# Patient Record
Sex: Female | Born: 1980
Health system: Southern US, Community
[De-identification: ages and names within clinical notes are randomized; demographics above are authoritative.]

## PROBLEM LIST (undated history)

## (undated) DIAGNOSIS — I10 Essential (primary) hypertension: Secondary | ICD-10-CM

## (undated) DIAGNOSIS — E559 Vitamin D deficiency, unspecified: Secondary | ICD-10-CM

## (undated) DIAGNOSIS — F32A Depression, unspecified: Secondary | ICD-10-CM

## (undated) DIAGNOSIS — F419 Anxiety disorder, unspecified: Secondary | ICD-10-CM

## (undated) DIAGNOSIS — E538 Deficiency of other specified B group vitamins: Secondary | ICD-10-CM

## (undated) DIAGNOSIS — R7303 Prediabetes: Secondary | ICD-10-CM

## (undated) DIAGNOSIS — G479 Sleep disorder, unspecified: Secondary | ICD-10-CM

## (undated) DIAGNOSIS — D219 Benign neoplasm of connective and other soft tissue, unspecified: Secondary | ICD-10-CM

## (undated) DIAGNOSIS — L509 Urticaria, unspecified: Secondary | ICD-10-CM

## (undated) DIAGNOSIS — G4730 Sleep apnea, unspecified: Secondary | ICD-10-CM

## (undated) DIAGNOSIS — D649 Anemia, unspecified: Secondary | ICD-10-CM

## (undated) DIAGNOSIS — I38 Endocarditis, valve unspecified: Secondary | ICD-10-CM

## (undated) DIAGNOSIS — F329 Major depressive disorder, single episode, unspecified: Secondary | ICD-10-CM

## (undated) DIAGNOSIS — O211 Hyperemesis gravidarum with metabolic disturbance: Secondary | ICD-10-CM

## (undated) HISTORY — DX: Anemia, unspecified: D64.9

## (undated) HISTORY — DX: Sleep apnea, unspecified: G47.30

## (undated) HISTORY — DX: Essential (primary) hypertension: I10

## (undated) HISTORY — DX: Vitamin D deficiency, unspecified: E55.9

## (undated) HISTORY — DX: Prediabetes: R73.03

## (undated) HISTORY — DX: Depression, unspecified: F32.A

## (undated) HISTORY — DX: Deficiency of other specified B group vitamins: E53.8

## (undated) HISTORY — DX: Urticaria, unspecified: L50.9

## (undated) HISTORY — DX: Endocarditis, valve unspecified: I38

## (undated) HISTORY — DX: Sleep disorder, unspecified: G47.9

## (undated) HISTORY — DX: Anxiety disorder, unspecified: F41.9

---
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
## 2005-01-08 ENCOUNTER — Emergency Department (HOSPITAL_COMMUNITY): Admission: EM | Admit: 2005-01-08 | Discharge: 2005-01-09 | Payer: Self-pay | Admitting: Emergency Medicine

## 2007-06-23 ENCOUNTER — Other Ambulatory Visit: Admission: RE | Admit: 2007-06-23 | Discharge: 2007-06-23 | Payer: Self-pay | Admitting: Gynecology

## 2008-11-19 ENCOUNTER — Inpatient Hospital Stay (HOSPITAL_COMMUNITY): Admission: AD | Admit: 2008-11-19 | Discharge: 2008-11-19 | Payer: Self-pay | Admitting: Obstetrics and Gynecology

## 2008-11-25 ENCOUNTER — Inpatient Hospital Stay (HOSPITAL_COMMUNITY): Admission: AD | Admit: 2008-11-25 | Discharge: 2008-11-25 | Payer: Self-pay | Admitting: Obstetrics and Gynecology

## 2008-11-30 ENCOUNTER — Inpatient Hospital Stay (HOSPITAL_COMMUNITY): Admission: AD | Admit: 2008-11-30 | Discharge: 2008-12-02 | Payer: Self-pay | Admitting: Obstetrics and Gynecology

## 2008-12-08 ENCOUNTER — Inpatient Hospital Stay (HOSPITAL_COMMUNITY): Admission: AD | Admit: 2008-12-08 | Discharge: 2008-12-12 | Payer: Self-pay | Admitting: Obstetrics and Gynecology

## 2008-12-23 ENCOUNTER — Inpatient Hospital Stay (HOSPITAL_COMMUNITY): Admission: AD | Admit: 2008-12-23 | Discharge: 2008-12-28 | Payer: Self-pay | Admitting: Obstetrics and Gynecology

## 2009-01-01 ENCOUNTER — Inpatient Hospital Stay (HOSPITAL_COMMUNITY): Admission: AD | Admit: 2009-01-01 | Discharge: 2009-01-01 | Payer: Self-pay | Admitting: Obstetrics and Gynecology

## 2009-01-08 ENCOUNTER — Observation Stay (HOSPITAL_COMMUNITY): Admission: AD | Admit: 2009-01-08 | Discharge: 2009-01-08 | Payer: Self-pay | Admitting: Obstetrics and Gynecology

## 2009-01-09 ENCOUNTER — Inpatient Hospital Stay (HOSPITAL_COMMUNITY): Admission: AD | Admit: 2009-01-09 | Discharge: 2009-01-10 | Payer: Self-pay | Admitting: Obstetrics and Gynecology

## 2009-01-20 ENCOUNTER — Inpatient Hospital Stay (HOSPITAL_COMMUNITY): Admission: AD | Admit: 2009-01-20 | Discharge: 2009-01-20 | Payer: Self-pay | Admitting: Obstetrics and Gynecology

## 2009-01-22 ENCOUNTER — Inpatient Hospital Stay (HOSPITAL_COMMUNITY): Admission: AD | Admit: 2009-01-22 | Discharge: 2009-01-23 | Payer: Self-pay | Admitting: Obstetrics and Gynecology

## 2009-01-23 ENCOUNTER — Ambulatory Visit (HOSPITAL_COMMUNITY): Admission: RE | Admit: 2009-01-23 | Discharge: 2009-01-23 | Payer: Self-pay | Admitting: Obstetrics and Gynecology

## 2009-01-24 ENCOUNTER — Inpatient Hospital Stay (HOSPITAL_COMMUNITY): Admission: AD | Admit: 2009-01-24 | Discharge: 2009-01-25 | Payer: Self-pay | Admitting: Obstetrics and Gynecology

## 2009-01-25 ENCOUNTER — Emergency Department (HOSPITAL_COMMUNITY): Admission: EM | Admit: 2009-01-25 | Discharge: 2009-01-25 | Payer: Self-pay | Admitting: Emergency Medicine

## 2009-01-29 ENCOUNTER — Inpatient Hospital Stay (HOSPITAL_COMMUNITY): Admission: AD | Admit: 2009-01-29 | Discharge: 2009-01-29 | Payer: Self-pay | Admitting: Obstetrics and Gynecology

## 2009-02-09 ENCOUNTER — Inpatient Hospital Stay (HOSPITAL_COMMUNITY): Admission: AD | Admit: 2009-02-09 | Discharge: 2009-02-17 | Payer: Self-pay | Admitting: Obstetrics and Gynecology

## 2009-03-02 HISTORY — PX: PICC LINE PLACE PERIPHERAL (ARMC HX): HXRAD1248

## 2009-03-15 ENCOUNTER — Emergency Department (HOSPITAL_COMMUNITY): Admission: EM | Admit: 2009-03-15 | Discharge: 2009-03-16 | Payer: Self-pay | Admitting: Emergency Medicine

## 2009-03-16 ENCOUNTER — Emergency Department (HOSPITAL_COMMUNITY): Admission: EM | Admit: 2009-03-16 | Discharge: 2009-03-16 | Payer: Self-pay | Admitting: Emergency Medicine

## 2009-03-17 ENCOUNTER — Emergency Department (HOSPITAL_COMMUNITY): Admission: EM | Admit: 2009-03-17 | Discharge: 2009-03-17 | Payer: Self-pay | Admitting: Emergency Medicine

## 2009-05-01 ENCOUNTER — Ambulatory Visit (HOSPITAL_COMMUNITY): Admission: RE | Admit: 2009-05-01 | Discharge: 2009-05-01 | Payer: Self-pay | Admitting: Obstetrics and Gynecology

## 2009-05-10 ENCOUNTER — Inpatient Hospital Stay (HOSPITAL_COMMUNITY): Admission: AD | Admit: 2009-05-10 | Discharge: 2009-05-10 | Payer: Self-pay | Admitting: Obstetrics and Gynecology

## 2009-05-11 ENCOUNTER — Emergency Department (HOSPITAL_COMMUNITY): Admission: EM | Admit: 2009-05-11 | Discharge: 2009-05-11 | Payer: Self-pay | Admitting: Emergency Medicine

## 2009-07-08 ENCOUNTER — Inpatient Hospital Stay (HOSPITAL_COMMUNITY): Admission: AD | Admit: 2009-07-08 | Discharge: 2009-07-08 | Payer: Self-pay | Admitting: Obstetrics and Gynecology

## 2009-07-09 ENCOUNTER — Inpatient Hospital Stay (HOSPITAL_COMMUNITY): Admission: AD | Admit: 2009-07-09 | Discharge: 2009-07-12 | Payer: Self-pay | Admitting: Obstetrics and Gynecology

## 2010-02-26 ENCOUNTER — Inpatient Hospital Stay (HOSPITAL_COMMUNITY)
Admission: AD | Admit: 2010-02-26 | Discharge: 2010-02-27 | Payer: Self-pay | Source: Home / Self Care | Attending: Obstetrics and Gynecology | Admitting: Obstetrics and Gynecology

## 2010-02-28 ENCOUNTER — Inpatient Hospital Stay (HOSPITAL_COMMUNITY)
Admission: AD | Admit: 2010-02-28 | Discharge: 2010-03-01 | Payer: Self-pay | Source: Home / Self Care | Attending: Obstetrics and Gynecology | Admitting: Obstetrics and Gynecology

## 2010-03-17 ENCOUNTER — Inpatient Hospital Stay (HOSPITAL_COMMUNITY)
Admission: AD | Admit: 2010-03-17 | Discharge: 2010-03-17 | Payer: Self-pay | Source: Home / Self Care | Attending: Obstetrics and Gynecology | Admitting: Obstetrics and Gynecology

## 2010-03-19 LAB — COMPREHENSIVE METABOLIC PANEL
ALT: 9 U/L (ref 0–35)
AST: 14 U/L (ref 0–37)
Albumin: 3.7 g/dL (ref 3.5–5.2)
Alkaline Phosphatase: 49 U/L (ref 39–117)
BUN: 11 mg/dL (ref 6–23)
CO2: 23 mEq/L (ref 19–32)
Calcium: 9.5 mg/dL (ref 8.4–10.5)
Chloride: 106 mEq/L (ref 96–112)
Creatinine, Ser: 0.56 mg/dL (ref 0.4–1.2)
GFR calc Af Amer: >60 mL/min (ref >60)
GFR calc non Af Amer: >60 mL/min (ref >60)
Glucose, Bld: 73 mg/dL (ref 70–99)
Potassium: 3.7 mEq/L (ref 3.5–5.1)
Sodium: 136 mEq/L (ref 135–145)
Total Bilirubin: 0.7 mg/dL (ref 0.3–1.2)
Total Protein: 6.5 g/dL (ref 6.0–8.3)

## 2010-03-19 LAB — CBC
HCT: 38.4 % (ref 36.0–46.0)
Hemoglobin: 13.2 g/dL (ref 12.0–15.0)
MCH: 29.8 pg (ref 26.0–34.0)
MCHC: 34.4 g/dL (ref 30.0–36.0)
MCV: 86.7 fL (ref 78.0–100.0)
Platelets: 248 10*3/uL (ref 150–400)
RBC: 4.43 MIL/uL (ref 3.87–5.11)
RDW: 13.2 % (ref 11.5–15.5)
WBC: 8.2 10*3/uL (ref 4.0–10.5)

## 2010-03-19 LAB — URINALYSIS, ROUTINE W REFLEX MICROSCOPIC
Bilirubin Urine: NEGATIVE
Hgb urine dipstick: NEGATIVE
Ketones, ur: >80 mg/dL — AB
Nitrite: NEGATIVE
Protein, ur: NEGATIVE mg/dL
Specific Gravity, Urine: >1.03 — ABNORMAL HIGH (ref 1.005–1.030)
Urine Glucose, Fasting: NEGATIVE mg/dL
Urobilinogen, UA: 0.2 mg/dL (ref 0.0–1.0)
pH: 6 (ref 5.0–8.0)

## 2010-03-19 LAB — TSH: TSH: 0.662 u[IU]/mL (ref 0.350–4.500)

## 2010-03-19 LAB — T3 UPTAKE: T3 Uptake Ratio: 22.4 % — ABNORMAL LOW (ref 22.5–37.0)

## 2010-03-19 LAB — PREALBUMIN: Prealbumin: 24.4 mg/dL (ref 17.0–34.0)

## 2010-03-19 LAB — T4, FREE: Free T4: 1.29 ng/dL (ref 0.80–1.80)

## 2010-03-20 ENCOUNTER — Inpatient Hospital Stay (HOSPITAL_COMMUNITY)
Admission: AD | Admit: 2010-03-20 | Discharge: 2010-03-21 | Payer: Self-pay | Source: Home / Self Care | Admitting: Obstetrics and Gynecology

## 2010-03-23 ENCOUNTER — Encounter: Payer: Self-pay | Admitting: Obstetrics and Gynecology

## 2010-03-24 LAB — URINALYSIS, ROUTINE W REFLEX MICROSCOPIC
Bilirubin Urine: NEGATIVE
Ketones, ur: >80 mg/dL — AB
Leukocytes, UA: NEGATIVE
Nitrite: NEGATIVE
Protein, ur: NEGATIVE mg/dL
Specific Gravity, Urine: >1.03 — ABNORMAL HIGH (ref 1.005–1.030)
Urine Glucose, Fasting: NEGATIVE mg/dL
Urobilinogen, UA: 0.2 mg/dL (ref 0.0–1.0)
pH: 6 (ref 5.0–8.0)

## 2010-03-24 LAB — URINE MICROSCOPIC-ADD ON

## 2010-03-30 ENCOUNTER — Inpatient Hospital Stay (HOSPITAL_COMMUNITY)
Admission: AD | Admit: 2010-03-30 | Discharge: 2010-03-30 | Payer: Self-pay | Source: Home / Self Care | Attending: Obstetrics and Gynecology | Admitting: Obstetrics and Gynecology

## 2010-03-30 LAB — URINE MICROSCOPIC-ADD ON

## 2010-03-30 LAB — URINALYSIS, ROUTINE W REFLEX MICROSCOPIC
Bilirubin Urine: NEGATIVE
Hgb urine dipstick: NEGATIVE
Ketones, ur: 15 mg/dL — AB
Nitrite: NEGATIVE
Protein, ur: NEGATIVE mg/dL
Specific Gravity, Urine: >1.03 — ABNORMAL HIGH (ref 1.005–1.030)
Urine Glucose, Fasting: NEGATIVE mg/dL
Urobilinogen, UA: 0.2 mg/dL (ref 0.0–1.0)
pH: 6 (ref 5.0–8.0)

## 2010-04-11 ENCOUNTER — Inpatient Hospital Stay (HOSPITAL_COMMUNITY)
Admission: AD | Admit: 2010-04-11 | Discharge: 2010-04-11 | Disposition: A | Discharge status: Home / Self Care | Payer: Medicaid Other | Source: Ambulatory Visit | Attending: Obstetrics and Gynecology | Admitting: Obstetrics and Gynecology

## 2010-04-11 DIAGNOSIS — O210 Mild hyperemesis gravidarum: Secondary | ICD-10-CM | POA: Insufficient documentation

## 2010-04-11 DIAGNOSIS — R824 Acetonuria: Secondary | ICD-10-CM | POA: Insufficient documentation

## 2010-04-11 LAB — COMPREHENSIVE METABOLIC PANEL
ALT: 10 U/L (ref 0–35)
AST: 11 U/L (ref 0–37)
Albumin: 3.5 g/dL (ref 3.5–5.2)
Alkaline Phosphatase: 48 U/L (ref 39–117)
BUN: 9 mg/dL (ref 6–23)
CO2: 22 mEq/L (ref 19–32)
Calcium: 9.3 mg/dL (ref 8.4–10.5)
Chloride: 107 mEq/L (ref 96–112)
Creatinine, Ser: 0.55 mg/dL (ref 0.4–1.2)
GFR calc Af Amer: >60 mL/min (ref >60)
GFR calc non Af Amer: >60 mL/min (ref >60)
Glucose, Bld: 82 mg/dL (ref 70–99)
Potassium: 3.5 mEq/L (ref 3.5–5.1)
Sodium: 137 mEq/L (ref 135–145)
Total Bilirubin: 1.1 mg/dL (ref 0.3–1.2)
Total Protein: 6.1 g/dL (ref 6.0–8.3)

## 2010-04-11 LAB — URINE MICROSCOPIC-ADD ON

## 2010-04-11 LAB — URINALYSIS, ROUTINE W REFLEX MICROSCOPIC
Hgb urine dipstick: NEGATIVE
Ketones, ur: >80 mg/dL — AB
Leukocytes, UA: NEGATIVE
Nitrite: NEGATIVE
Protein, ur: 30 mg/dL — AB
Specific Gravity, Urine: >1.03 — ABNORMAL HIGH (ref 1.005–1.030)
Urine Glucose, Fasting: NEGATIVE mg/dL
Urobilinogen, UA: 1 mg/dL (ref 0.0–1.0)
pH: 6 (ref 5.0–8.0)

## 2010-04-11 LAB — PREALBUMIN: Prealbumin: 21.5 mg/dL (ref 17.0–34.0)

## 2010-04-15 LAB — HIV ANTIBODY (ROUTINE TESTING W REFLEX): HIV: NONREACTIVE

## 2010-04-15 LAB — HEPATITIS B SURFACE ANTIGEN: Hepatitis B Surface Ag: NEGATIVE

## 2010-04-15 LAB — RPR: RPR: NONREACTIVE

## 2010-04-15 LAB — ABO/RH: "RH Type ": POSITIVE

## 2010-05-12 LAB — URINALYSIS, ROUTINE W REFLEX MICROSCOPIC
Bilirubin Urine: NEGATIVE
Bilirubin Urine: NEGATIVE
Glucose, UA: NEGATIVE mg/dL
Ketones, ur: >80 mg/dL — AB
Ketones, ur: >80 mg/dL — AB
Leukocytes, UA: NEGATIVE
Nitrite: NEGATIVE
Nitrite: NEGATIVE
Protein, ur: NEGATIVE mg/dL
Specific Gravity, Urine: >1.03 — ABNORMAL HIGH (ref 1.005–1.030)
Urobilinogen, UA: 0.2 mg/dL (ref 0.0–1.0)
Urobilinogen, UA: 1 mg/dL (ref 0.0–1.0)
pH: 6 (ref 5.0–8.0)

## 2010-05-12 LAB — URINE CULTURE: Colony Count: 30000

## 2010-05-12 LAB — URINE MICROSCOPIC-ADD ON

## 2010-05-19 LAB — CULTURE, ROUTINE-ABSCESS: Culture: NO GROWTH

## 2010-05-19 LAB — URINALYSIS, ROUTINE W REFLEX MICROSCOPIC
Bilirubin Urine: NEGATIVE
Glucose, UA: NEGATIVE mg/dL
Hgb urine dipstick: NEGATIVE
Ketones, ur: 40 mg/dL — AB
Protein, ur: NEGATIVE mg/dL
pH: 7.5 (ref 5.0–8.0)

## 2010-05-20 LAB — CBC
HCT: 31.7 % — ABNORMAL LOW (ref 36.0–46.0)
Hemoglobin: 10.7 g/dL — ABNORMAL LOW (ref 12.0–15.0)
MCHC: 34 g/dL (ref 30.0–36.0)
Platelets: 202 10*3/uL (ref 150–400)
RBC: 3.51 MIL/uL — ABNORMAL LOW (ref 3.87–5.11)
RBC: 4.13 MIL/uL (ref 3.87–5.11)
WBC: 12.1 10*3/uL — ABNORMAL HIGH (ref 4.0–10.5)
WBC: 13.8 10*3/uL — ABNORMAL HIGH (ref 4.0–10.5)

## 2010-05-20 LAB — RPR: RPR Ser Ql: NONREACTIVE

## 2010-05-26 LAB — URINALYSIS, ROUTINE W REFLEX MICROSCOPIC
Bilirubin Urine: NEGATIVE
Glucose, UA: NEGATIVE mg/dL
Nitrite: NEGATIVE
Specific Gravity, Urine: <1.005 — ABNORMAL LOW (ref 1.005–1.030)
pH: 7 (ref 5.0–8.0)

## 2010-05-26 LAB — WET PREP, GENITAL: Yeast Wet Prep HPF POC: NONE SEEN

## 2010-05-26 LAB — GC/CHLAMYDIA PROBE AMP, GENITAL: Chlamydia, DNA Probe: NEGATIVE

## 2010-06-02 LAB — GLUCOSE, CAPILLARY
Glucose-Capillary: 102 mg/dL — ABNORMAL HIGH (ref 70–99)
Glucose-Capillary: 123 mg/dL — ABNORMAL HIGH (ref 70–99)

## 2010-06-03 LAB — COMPREHENSIVE METABOLIC PANEL
AST: 15 U/L (ref 0–37)
Albumin: 2.5 g/dL — ABNORMAL LOW (ref 3.5–5.2)
Calcium: 8.3 mg/dL — ABNORMAL LOW (ref 8.4–10.5)
Creatinine, Ser: 0.44 mg/dL (ref 0.4–1.2)
GFR calc Af Amer: >60 mL/min (ref >60)
Total Protein: 4.9 g/dL — ABNORMAL LOW (ref 6.0–8.3)

## 2010-06-03 LAB — KETONES, URINE: Ketones, ur: NEGATIVE mg/dL

## 2010-06-03 LAB — URINALYSIS, ROUTINE W REFLEX MICROSCOPIC
Nitrite: NEGATIVE
Specific Gravity, Urine: 1.015 (ref 1.005–1.030)
Urobilinogen, UA: 0.2 mg/dL (ref 0.0–1.0)

## 2010-06-04 LAB — URINALYSIS, ROUTINE W REFLEX MICROSCOPIC
Bilirubin Urine: NEGATIVE
Glucose, UA: NEGATIVE mg/dL
Glucose, UA: NEGATIVE mg/dL
Glucose, UA: NEGATIVE mg/dL
Ketones, ur: 15 mg/dL — AB
Ketones, ur: >80 mg/dL — AB
Nitrite: NEGATIVE
Nitrite: NEGATIVE
Protein, ur: NEGATIVE mg/dL
Protein, ur: NEGATIVE mg/dL
Protein, ur: NEGATIVE mg/dL
Specific Gravity, Urine: 1.025 (ref 1.005–1.030)
Urobilinogen, UA: 0.2 mg/dL (ref 0.0–1.0)
Urobilinogen, UA: 0.2 mg/dL (ref 0.0–1.0)
pH: 5.5 (ref 5.0–8.0)
pH: 6 (ref 5.0–8.0)
pH: 7 (ref 5.0–8.0)

## 2010-06-04 LAB — COMPREHENSIVE METABOLIC PANEL
Albumin: 3.1 g/dL — ABNORMAL LOW (ref 3.5–5.2)
Alkaline Phosphatase: 52 U/L (ref 39–117)
BUN: 1 mg/dL — ABNORMAL LOW (ref 6–23)
Calcium: 9.1 mg/dL (ref 8.4–10.5)
Creatinine, Ser: 0.53 mg/dL (ref 0.4–1.2)
Glucose, Bld: 69 mg/dL — ABNORMAL LOW (ref 70–99)
Total Protein: 5.9 g/dL — ABNORMAL LOW (ref 6.0–8.3)

## 2010-06-04 LAB — URINALYSIS, DIPSTICK ONLY
Bilirubin Urine: NEGATIVE
Bilirubin Urine: NEGATIVE
Glucose, UA: NEGATIVE mg/dL
Glucose, UA: 250 mg/dL — AB
Hgb urine dipstick: NEGATIVE
Nitrite: NEGATIVE
Specific Gravity, Urine: 1.02 (ref 1.005–1.030)
Specific Gravity, Urine: 1.01 (ref 1.005–1.030)
Urobilinogen, UA: 1 mg/dL (ref 0.0–1.0)
pH: 8 (ref 5.0–8.0)
pH: 8 (ref 5.0–8.0)

## 2010-06-04 LAB — PREALBUMIN: Prealbumin: 14.6 mg/dL — ABNORMAL LOW (ref 18.0–45.0)

## 2010-06-05 LAB — GLUCOSE, CAPILLARY
Glucose-Capillary: 109 mg/dL — ABNORMAL HIGH (ref 70–99)
Glucose-Capillary: 110 mg/dL — ABNORMAL HIGH (ref 70–99)
Glucose-Capillary: 117 mg/dL — ABNORMAL HIGH (ref 70–99)
Glucose-Capillary: 123 mg/dL — ABNORMAL HIGH (ref 70–99)
Glucose-Capillary: 69 mg/dL — ABNORMAL LOW (ref 70–99)
Glucose-Capillary: 99 mg/dL (ref 70–99)
Glucose-Capillary: 127 mg/dL — ABNORMAL HIGH (ref 70–99)
Glucose-Capillary: 97 mg/dL (ref 70–99)

## 2010-06-05 LAB — COMPREHENSIVE METABOLIC PANEL
ALT: 10 U/L (ref 0–35)
AST: 22 U/L (ref 0–37)
Albumin: 3.6 g/dL (ref 3.5–5.2)
Alkaline Phosphatase: 35 U/L — ABNORMAL LOW (ref 39–117)
BUN: 3 mg/dL — ABNORMAL LOW (ref 6–23)
BUN: 6 mg/dL (ref 6–23)
Calcium: 8.9 mg/dL (ref 8.4–10.5)
Creatinine, Ser: 0.6 mg/dL (ref 0.4–1.2)
Creatinine, Ser: 0.7 mg/dL (ref 0.4–1.2)
GFR calc Af Amer: >60 mL/min (ref >60)
GFR calc non Af Amer: >60 mL/min (ref >60)
Glucose, Bld: 98 mg/dL (ref 70–99)
Potassium: 3.5 mEq/L (ref 3.5–5.1)
Sodium: 134 mEq/L — ABNORMAL LOW (ref 135–145)
Total Protein: 5.2 g/dL — ABNORMAL LOW (ref 6.0–8.3)
Total Protein: 6.4 g/dL (ref 6.0–8.3)
Total Protein: 6.2 g/dL (ref 6.0–8.3)

## 2010-06-05 LAB — URINALYSIS, DIPSTICK ONLY
Bilirubin Urine: NEGATIVE
Glucose, UA: NEGATIVE mg/dL
Glucose, UA: NEGATIVE mg/dL
Ketones, ur: >80 mg/dL — AB
Ketones, ur: NEGATIVE mg/dL
Leukocytes, UA: NEGATIVE
Protein, ur: NEGATIVE mg/dL
pH: 5.5 (ref 5.0–8.0)
pH: 7.5 (ref 5.0–8.0)

## 2010-06-05 LAB — URINALYSIS, ROUTINE W REFLEX MICROSCOPIC
Bilirubin Urine: NEGATIVE
Glucose, UA: NEGATIVE mg/dL
Hgb urine dipstick: NEGATIVE
Ketones, ur: >80 mg/dL — AB
Ketones, ur: NEGATIVE mg/dL
Nitrite: NEGATIVE
Nitrite: NEGATIVE
Protein, ur: NEGATIVE mg/dL
Protein, ur: NEGATIVE mg/dL
Specific Gravity, Urine: >1.03 — ABNORMAL HIGH (ref 1.005–1.030)
pH: 6 (ref 5.0–8.0)
pH: 6.5 (ref 5.0–8.0)
pH: 6 (ref 5.0–8.0)

## 2010-06-05 LAB — CBC
HCT: 33.1 % — ABNORMAL LOW (ref 36.0–46.0)
Hemoglobin: 11.3 g/dL — ABNORMAL LOW (ref 12.0–15.0)
MCHC: 34 g/dL (ref 30.0–36.0)
Platelets: 232 10*3/uL (ref 150–400)
RDW: 14.3 % (ref 11.5–15.5)
WBC: 6.8 10*3/uL (ref 4.0–10.5)

## 2010-06-05 LAB — URINE MICROSCOPIC-ADD ON

## 2010-06-06 LAB — URINALYSIS, ROUTINE W REFLEX MICROSCOPIC
Bilirubin Urine: NEGATIVE
Bilirubin Urine: NEGATIVE
Glucose, UA: NEGATIVE mg/dL
Hgb urine dipstick: NEGATIVE
Ketones, ur: >80 mg/dL — AB
Ketones, ur: NEGATIVE mg/dL
Nitrite: NEGATIVE
Protein, ur: NEGATIVE mg/dL
Specific Gravity, Urine: >1.03 — ABNORMAL HIGH (ref 1.005–1.030)
Urobilinogen, UA: 0.2 mg/dL (ref 0.0–1.0)
Urobilinogen, UA: 0.2 mg/dL (ref 0.0–1.0)

## 2010-06-06 LAB — COMPREHENSIVE METABOLIC PANEL
ALT: 11 U/L (ref 0–35)
Albumin: 3.7 g/dL (ref 3.5–5.2)
Alkaline Phosphatase: 50 U/L (ref 39–117)
Alkaline Phosphatase: 51 U/L (ref 39–117)
BUN: 12 mg/dL (ref 6–23)
BUN: 7 mg/dL (ref 6–23)
Calcium: 9.5 mg/dL (ref 8.4–10.5)
Chloride: 101 mEq/L (ref 96–112)
Creatinine, Ser: 0.54 mg/dL (ref 0.4–1.2)
Glucose, Bld: 73 mg/dL (ref 70–99)
Glucose, Bld: 90 mg/dL (ref 70–99)
Potassium: 3.4 mEq/L — ABNORMAL LOW (ref 3.5–5.1)
Potassium: 3.8 mEq/L (ref 3.5–5.1)
Sodium: 131 mEq/L — ABNORMAL LOW (ref 135–145)
Sodium: 134 mEq/L — ABNORMAL LOW (ref 135–145)
Total Bilirubin: 0.4 mg/dL (ref 0.3–1.2)
Total Protein: 6.8 g/dL (ref 6.0–8.3)
Total Protein: 6.9 g/dL (ref 6.0–8.3)
Total Protein: 6.9 g/dL (ref 6.0–8.3)

## 2010-06-06 LAB — CBC
HCT: 36.9 % (ref 36.0–46.0)
HCT: 37.7 % (ref 36.0–46.0)
Hemoglobin: 12.5 g/dL (ref 12.0–15.0)
Hemoglobin: 13 g/dL (ref 12.0–15.0)
MCHC: 34.5 g/dL (ref 30.0–36.0)
MCV: 91 fL (ref 78.0–100.0)
Platelets: 263 10*3/uL (ref 150–400)
RDW: 13.4 % (ref 11.5–15.5)
RDW: 13.6 % (ref 11.5–15.5)
WBC: 10.8 10*3/uL — ABNORMAL HIGH (ref 4.0–10.5)

## 2010-06-06 LAB — TSH: TSH: 0.956 u[IU]/mL (ref 0.350–4.500)

## 2010-06-06 LAB — LIPASE, BLOOD: Lipase: 37 U/L (ref 11–59)

## 2010-06-06 LAB — AMYLASE: Amylase: 114 U/L (ref 27–131)

## 2010-06-11 ENCOUNTER — Inpatient Hospital Stay (HOSPITAL_COMMUNITY)
Admission: AD | Admit: 2010-06-11 | Discharge: 2010-06-12 | Disposition: A | Discharge status: Home / Self Care | Payer: Medicaid Other | Source: Ambulatory Visit | Attending: Obstetrics and Gynecology | Admitting: Obstetrics and Gynecology

## 2010-06-11 DIAGNOSIS — K2990 Gastroduodenitis, unspecified, without bleeding: Secondary | ICD-10-CM | POA: Insufficient documentation

## 2010-06-11 DIAGNOSIS — O210 Mild hyperemesis gravidarum: Secondary | ICD-10-CM | POA: Insufficient documentation

## 2010-06-11 DIAGNOSIS — K2970 Gastritis, unspecified, without bleeding: Secondary | ICD-10-CM | POA: Insufficient documentation

## 2010-06-11 LAB — URINALYSIS, ROUTINE W REFLEX MICROSCOPIC
Glucose, UA: NEGATIVE mg/dL
pH: 6 (ref 5.0–8.0)

## 2010-10-06 LAB — STREP B DNA PROBE: GBS: NEGATIVE

## 2010-10-17 ENCOUNTER — Inpatient Hospital Stay (HOSPITAL_COMMUNITY): Payer: Medicaid Other | Admitting: Anesthesiology

## 2010-10-17 ENCOUNTER — Encounter (HOSPITAL_COMMUNITY): Payer: Self-pay | Admitting: Anesthesiology

## 2010-10-17 ENCOUNTER — Encounter (HOSPITAL_COMMUNITY): Payer: Self-pay | Admitting: *Deleted

## 2010-10-17 ENCOUNTER — Inpatient Hospital Stay (HOSPITAL_COMMUNITY)
Admission: AD | Admit: 2010-10-17 | Discharge: 2010-10-19 | DRG: 775 | Disposition: A | Discharge status: Home / Self Care | Payer: Medicaid Other | Source: Ambulatory Visit | Attending: Obstetrics and Gynecology | Admitting: Obstetrics and Gynecology

## 2010-10-17 ENCOUNTER — Encounter (HOSPITAL_COMMUNITY): Payer: Self-pay

## 2010-10-17 DIAGNOSIS — O211 Hyperemesis gravidarum with metabolic disturbance: Secondary | ICD-10-CM | POA: Insufficient documentation

## 2010-10-17 DIAGNOSIS — Z3490 Encounter for supervision of normal pregnancy, unspecified, unspecified trimester: Secondary | ICD-10-CM

## 2010-10-17 DIAGNOSIS — IMO0001 Reserved for inherently not codable concepts without codable children: Secondary | ICD-10-CM

## 2010-10-17 HISTORY — DX: Hyperemesis gravidarum with metabolic disturbance: O21.1

## 2010-10-17 LAB — CBC
HCT: 36.9 % (ref 36.0–46.0)
Hemoglobin: 12.4 g/dL (ref 12.0–15.0)
MCH: 30.3 pg (ref 26.0–34.0)
MCHC: 33.6 g/dL (ref 30.0–36.0)
MCV: 90.2 fL (ref 78.0–100.0)
RBC: 4.09 MIL/uL (ref 3.87–5.11)

## 2010-10-17 MED ORDER — FENTANYL 2.5 MCG/ML BUPIVACAINE 1/10 % EPIDURAL INFUSION (WH - ANES)
INTRAMUSCULAR | Status: DC | PRN
  Administered 2010-10-17: 14 mL/h via EPIDURAL

## 2010-10-17 MED ORDER — PRENATAL PLUS 27-1 MG PO TABS
1.0000 | ORAL_TABLET | Freq: Every day | ORAL | Status: DC
  Administered 2010-10-18: 1 via ORAL
  Filled 2010-10-17: qty 1

## 2010-10-17 MED ORDER — DIBUCAINE 1 % RE OINT: 1.0000 "application " | TOPICAL_OINTMENT | RECTAL | Status: DC | PRN

## 2010-10-17 MED ORDER — BENZOCAINE-MENTHOL 20-0.5 % EX AERO
INHALATION_SPRAY | CUTANEOUS | Status: AC
  Filled 2010-10-17: qty 56

## 2010-10-17 MED ORDER — OXYCODONE-ACETAMINOPHEN 5-325 MG PO TABS: 2.0000 | ORAL_TABLET | ORAL | Status: DC | PRN

## 2010-10-17 MED ORDER — IBUPROFEN 600 MG PO TABS: 600.0000 mg | ORAL_TABLET | Freq: Four times a day (QID) | ORAL | Status: DC | PRN

## 2010-10-17 MED ORDER — BENZOCAINE-MENTHOL 20-0.5 % EX AERO: 1.0000 "application " | INHALATION_SPRAY | CUTANEOUS | Status: DC | PRN

## 2010-10-17 MED ORDER — ONDANSETRON HCL 4 MG/2ML IJ SOLN: 4.0000 mg | Freq: Four times a day (QID) | INTRAMUSCULAR | Status: DC | PRN

## 2010-10-17 MED ORDER — ACETAMINOPHEN 325 MG PO TABS: 650.0000 mg | ORAL_TABLET | ORAL | Status: DC | PRN

## 2010-10-17 MED ORDER — DIPHENHYDRAMINE HCL 25 MG PO CAPS: 25.0000 mg | ORAL_CAPSULE | Freq: Four times a day (QID) | ORAL | Status: DC | PRN

## 2010-10-17 MED ORDER — LACTATED RINGERS IV SOLN
INTRAVENOUS | Status: DC
  Administered 2010-10-17 (×2): via INTRAVENOUS
  Administered 2010-10-17: 750 mL via INTRAVENOUS

## 2010-10-17 MED ORDER — PHENYLEPHRINE 40 MCG/ML (10ML) SYRINGE FOR IV PUSH (FOR BLOOD PRESSURE SUPPORT)
80.0000 ug | PREFILLED_SYRINGE | INTRAVENOUS | Status: DC | PRN
  Administered 2010-10-17: 80 ug via INTRAVENOUS
  Filled 2010-10-17: qty 5

## 2010-10-17 MED ORDER — OXYCODONE-ACETAMINOPHEN 5-325 MG PO TABS: 1.0000 | ORAL_TABLET | ORAL | Status: DC | PRN

## 2010-10-17 MED ORDER — LIDOCAINE HCL (PF) 1 % IJ SOLN
30.0000 mL | INTRAMUSCULAR | Status: DC | PRN
  Filled 2010-10-17 (×2): qty 30

## 2010-10-17 MED ORDER — EPHEDRINE 5 MG/ML INJ
10.0000 mg | INTRAVENOUS | Status: AC | PRN
  Administered 2010-10-17 (×2): 10 mg via INTRAVENOUS
  Filled 2010-10-17: qty 4

## 2010-10-17 MED ORDER — FLEET ENEMA 7-19 GM/118ML RE ENEM: 1.0000 | ENEMA | RECTAL | Status: DC | PRN

## 2010-10-17 MED ORDER — CITRIC ACID-SODIUM CITRATE 334-500 MG/5ML PO SOLN: 30.0000 mL | ORAL | Status: DC | PRN

## 2010-10-17 MED ORDER — DIPHENHYDRAMINE HCL 50 MG/ML IJ SOLN: 12.5000 mg | INTRAMUSCULAR | Status: DC | PRN

## 2010-10-17 MED ORDER — MEASLES, MUMPS & RUBELLA VAC ~~LOC~~ INJ: 0.5000 mL | INJECTION | Freq: Once | SUBCUTANEOUS | Status: DC

## 2010-10-17 MED ORDER — IBUPROFEN 600 MG PO TABS
600.0000 mg | ORAL_TABLET | Freq: Four times a day (QID) | ORAL | Status: DC
  Administered 2010-10-17 – 2010-10-19 (×6): 600 mg via ORAL
  Filled 2010-10-17 (×6): qty 1

## 2010-10-17 MED ORDER — LACTATED RINGERS IV SOLN
500.0000 mL | INTRAVENOUS | Status: DC | PRN
  Administered 2010-10-17: 500 mL via INTRAVENOUS

## 2010-10-17 MED ORDER — OXYTOCIN BOLUS FROM INFUSION
500.0000 mL | Freq: Once | INTRAVENOUS | Status: DC
  Filled 2010-10-17 (×2): qty 1000
  Filled 2010-10-17: qty 500

## 2010-10-17 MED ORDER — LACTATED RINGERS IV SOLN: 500.0000 mL | Freq: Once | INTRAVENOUS | Status: DC

## 2010-10-17 MED ORDER — SENNOSIDES-DOCUSATE SODIUM 8.6-50 MG PO TABS: 2.0000 | ORAL_TABLET | Freq: Every day | ORAL | Status: DC

## 2010-10-17 MED ORDER — ONDANSETRON HCL 4 MG/2ML IJ SOLN: 4.0000 mg | INTRAMUSCULAR | Status: DC | PRN

## 2010-10-17 MED ORDER — LACTATED RINGERS IV SOLN
INTRAVENOUS | Status: DC
  Administered 2010-10-17: 300 mL via INTRAUTERINE

## 2010-10-17 MED ORDER — ZOLPIDEM TARTRATE 5 MG PO TABS: 5.0000 mg | ORAL_TABLET | Freq: Every evening | ORAL | Status: DC | PRN

## 2010-10-17 MED ORDER — FENTANYL CITRATE 0.05 MG/ML IJ SOLN
100.0000 ug | Freq: Once | INTRAMUSCULAR | Status: AC
  Administered 2010-10-17: 100 ug via INTRAVENOUS
  Filled 2010-10-17: qty 2

## 2010-10-17 MED ORDER — SIMETHICONE 80 MG PO CHEW: 80.0000 mg | CHEWABLE_TABLET | ORAL | Status: DC | PRN

## 2010-10-17 MED ORDER — LANOLIN HYDROUS EX OINT: TOPICAL_OINTMENT | CUTANEOUS | Status: DC | PRN

## 2010-10-17 MED ORDER — FENTANYL 2.5 MCG/ML BUPIVACAINE 1/10 % EPIDURAL INFUSION (WH - ANES)
14.0000 mL/h | INTRAMUSCULAR | Status: DC
  Filled 2010-10-17: qty 60

## 2010-10-17 MED ORDER — PHENYLEPHRINE 40 MCG/ML (10ML) SYRINGE FOR IV PUSH (FOR BLOOD PRESSURE SUPPORT): 80.0000 ug | PREFILLED_SYRINGE | INTRAVENOUS | Status: DC | PRN

## 2010-10-17 MED ORDER — TETANUS-DIPHTH-ACELL PERTUSSIS 5-2.5-18.5 LF-MCG/0.5 IM SUSP
0.5000 mL | Freq: Once | INTRAMUSCULAR | Status: DC
  Filled 2010-10-17: qty 0.5

## 2010-10-17 MED ORDER — ONDANSETRON HCL 4 MG PO TABS: 4.0000 mg | ORAL_TABLET | ORAL | Status: DC | PRN

## 2010-10-17 MED ORDER — OXYTOCIN 20 UNITS IN LACTATED RINGERS INFUSION - SIMPLE: 125.0000 mL/h | INTRAVENOUS | Status: DC

## 2010-10-17 MED ORDER — OXYTOCIN 20 UNITS IN LACTATED RINGERS INFUSION - SIMPLE
125.0000 mL/h | INTRAVENOUS | Status: DC
  Administered 2010-10-17: 999 mL/h via INTRAVENOUS

## 2010-10-17 MED ORDER — LIDOCAINE HCL 1.5 % IJ SOLN
INTRAMUSCULAR | Status: DC | PRN
  Administered 2010-10-17 (×2): 5 mL via EPIDURAL

## 2010-10-17 MED ORDER — FERROUS SULFATE 325 (65 FE) MG PO TABS
325.0000 mg | ORAL_TABLET | Freq: Two times a day (BID) | ORAL | Status: DC
  Filled 2010-10-17: qty 1

## 2010-10-17 MED ORDER — WITCH HAZEL-GLYCERIN EX PADS: 1.0000 "application " | MEDICATED_PAD | CUTANEOUS | Status: DC | PRN

## 2010-10-17 MED ORDER — EPHEDRINE 5 MG/ML INJ: 10.0000 mg | INTRAVENOUS | Status: DC | PRN

## 2010-10-17 MED ORDER — NALBUPHINE SYRINGE 5 MG/0.5 ML
5.0000 mg | INJECTION | INTRAMUSCULAR | Status: DC | PRN
  Administered 2010-10-17: 5 mg via INTRAVENOUS
  Filled 2010-10-17: qty 0.5

## 2010-10-17 NOTE — Consult Note (Signed)
Neonatology Note:   Attendance at delivery:    I was asked to attend this NSVD at term. The mother is a G2P1 with meconium-stained fluid and amnioinfusion, without fetal distress. Upon delivery of the head, Dr. Estanislado Pandy did bulb suctioning, then again after delivery of infant and before much crying. We then bulb suctioned again for thin green fluid. Infant vigorous with good spontaneous cry and tone. Ap 9/9. Lungs clear to ausc in DR. To CN to care of Pediatrician.   Deatra James, MD

## 2010-10-17 NOTE — Progress Notes (Signed)
Attempted pushing c Dr Estanislado Pandy..  Fair push, but will allow pt to regain more feeling before pushing c every UC.

## 2010-10-17 NOTE — Progress Notes (Signed)
Vomiting

## 2010-10-17 NOTE — Progress Notes (Signed)
Very groggy

## 2010-10-17 NOTE — Progress Notes (Signed)
Extremely groggy.  Unsure if she's just sleepy or feels light-headed.

## 2010-10-17 NOTE — Anesthesia Preprocedure Evaluation (Signed)
Anesthesia Evaluation  Name, MR# and DOB Patient awake  General Assessment Comment  Reviewed: Allergy & Precautions, H&P , NPO status , Patient's Chart, lab work & pertinent test results  Airway Mallampati: II TM Distance: >3 FB Neck ROM: full    Dental No notable dental hx. (+) Teeth Intact   Pulmonary  clear to auscultation  pulmonary exam normalPulmonary Exam Normal breath sounds clear to auscultation none    Cardiovascular     Neuro/Psych Negative Neurological ROS  Negative Psych ROS  GI/Hepatic/Renal negative GI ROS, negative Liver ROS, and negative Renal ROS (+)       Endo/Other  Negative Endocrine ROS (+)      Abdominal Normal abdominal exam  (+)   Musculoskeletal negative musculoskeletal ROS (+)   Hematology negative hematology ROS (+)   Peds  Reproductive/Obstetrics (+) Pregnancy    Anesthesia Other Findings             Anesthesia Physical Anesthesia Plan  ASA: II  Anesthesia Plan: Epidural   Post-op Pain Management:    Induction:   Airway Management Planned:   Additional Equipment:   Intra-op Plan:   Post-operative Plan:   Informed Consent: I have reviewed the patients History and Physical, chart, labs and discussed the procedure including the risks, benefits and alternatives for the proposed anesthesia with the patient or authorized representative who has indicated his/her understanding and acceptance.     Plan Discussed with:   Anesthesia Plan Comments:         Anesthesia Quick Evaluation  

## 2010-10-17 NOTE — Progress Notes (Signed)
  Laurie Choi is a 30 y.o. G2P1 at 29w5dadmitted for active labor  Subjective:  Comfortable with  desires to avoid epidural Contractions every 3 minutes, lasting 60 seconds, intensity 9/10    Objective: BP 131/87  Pulse 91  Temp(Src) 98.9 F (37.2 C) (Oral)  Resp 16  Ht 5\' 3"  (1.6 m)  Wt 79.833 kg (176 lb)  BMI 31.18 kg/m2  SpO2 99%      FHT: tracing reviewed and reassuring SVE:   Dilation: 4 Effacement (%): 90 Station: -1 Exam by:: D TAYLOR CNM   Labs: Lab Results  Component Value Date   WBC 8.2 03/17/2010   HGB 13.2 03/17/2010   HCT 38.4 03/17/2010   MCV 86.7 03/17/2010   PLT 248 03/17/2010    Assessment / Plan: Spontaneous labor, progressing normally Fetal Wellbeing: reassuring Anticipated MOD:  NSVD IV pain management reviewed  Laurie Choi A 10/17/2010, 3:02 PM

## 2010-10-17 NOTE — Progress Notes (Signed)
S: called in room by nurse for c/o difficulty breathing. Patient is still drowsy with previous narcotics  O: no apparent distress      O2 sat 96-99 % with mask      Contractions every 2 minutes, palpating strong with adequate MVU      FHR reassuring with occasional mild variable decel      VE: complete, +3 station, LOA      Attempt to push unsuccessful due to weakness; epidural d/c  A: imminent delivery  P: awaiting return of muscle strength to initiate pushing

## 2010-10-17 NOTE — Anesthesia Postprocedure Evaluation (Signed)
Anesthesia Post Note  Patient: Laurie Choi  Procedure(s) Performed: * No procedures listed *  Anesthesia type: Epidural  Patient location: Mother/Baby  Post pain: Pain level controlled  Post assessment: Post-op Vital signs reviewed  Last Vitals:  Filed Vitals:   10/17/10 1914  BP:   Pulse: 90  Temp:   Resp:     Post vital signs: Reviewed  Level of consciousness: awake  Complications: No apparent anesthesia complications

## 2010-10-17 NOTE — H&P (Signed)
Laurie Choi is a 30 y.o. female presenting for Active Labor. Maternal Medical History:  Reason for admission: Reason for admission: contractions.  Contractions: Onset was 3-5 hours ago.   Frequency: regular.   Duration is approximately 60 seconds.   Perceived severity is strong.    Fetal activity: Perceived fetal activity is normal.   Last perceived fetal movement was within the past hour.    Prenatal complications: No bleeding, cholelithiasis, HIV, hypertension, infection, IUGR, nephrolithiasis, oligohydramnios, placental abnormality, polyhydramnios, pre-eclampsia, preterm labor, substance abuse, thrombocytopenia or thrombophilia.    Closely Spaced Pregnancy OB History    Grav Para Term Preterm Abortions TAB SAB Ect Mult Living   2 1        1      Past Medical History  Diagnosis Date  . No pertinent past medical history    No past surgical history on file. Family History: family history is not on file. Social History:  reports that she has never smoked. She does not have any smokeless tobacco history on file. She reports that she does not drink alcohol or use illicit drugs.  Review of Systems  Constitutional: Negative.   HENT: Negative.   Eyes: Negative.   Respiratory: Negative.   Cardiovascular: Negative.   Gastrointestinal: Negative.   Genitourinary: Negative.   Musculoskeletal: Negative.   Skin: Negative.   Neurological: Negative.   Endo/Heme/Allergies: Negative.   Psychiatric/Behavioral: Negative.     Dilation: 2.5 Effacement (%): 90 Station: -1 Exam by:: D TAYLOR CNM  Blood pressure 131/87, pulse 91, temperature 98.9 F (37.2 C), temperature source Oral, resp. rate 16, height 5\' 3"  (1.6 m), weight 79.833 kg (176 lb), SpO2 99.00%. Maternal Exam:  Uterine Assessment: Contraction strength is moderate.  Contraction frequency is regular.   Abdomen: Fundal height is AGA.   Estimated fetal weight is 7#.   Fetal presentation: vertex  Introitus: Normal  vulva. Vagina is positive for vaginal discharge (Bloody show).  Pelvis: adequate for delivery.   Cervix: Cervix evaluated by digital exam.     Fetal Exam Fetal Monitor Review: Mode: ultrasound.   Baseline rate: 130.  Variability: moderate (6-25 bpm).   Pattern: accelerations present and no decelerations.    Fetal State Assessment: Category I - tracings are normal.     Physical Exam  Constitutional: She is oriented to person, place, and time. She appears well-developed and well-nourished.  HENT:  Head: Normocephalic.  Neck: Normal range of motion.  Cardiovascular: Normal rate.   Respiratory: Effort normal and breath sounds normal.  GI: Soft. She exhibits no distension and no mass. There is no tenderness. There is no rebound and no guarding.  Genitourinary: Uterus normal. Vaginal discharge (Bloody show) found.  Musculoskeletal: Normal range of motion. She exhibits no edema and no tenderness.  Neurological: She is alert and oriented to person, place, and time.  Skin: Skin is warm and dry. No rash noted. No erythema. No pallor.  Psychiatric: She has a normal mood and affect. Her behavior is normal. Judgment and thought content normal.   SVE was 2+/90/-1 prior to ambulation in MAU now 4/90/-1 BOW palpable and pt desires IV pain med. Prenatal labs: ABO, Rh:  B pos Antibody:  neg Rubella:  immune RPR:   NR HBsAg:   Neg HIV:   NR GBS:   Neg 1 Hr GTT 83 GCC Neg/Neg Pap Neg Assessment/Plan: 1. 30 y/o G2P1 @ 39.5 wks 2. Active Labor 3. Cat I Reactive NST 4. Neg GBS 5. Desires IV Pain management  Plan: Admit to MD service for Routine Intrapartum orders Dr. Estanislado Pandy Aware   Anabel Halon 10/17/2010, 2:14 PM

## 2010-10-17 NOTE — Progress Notes (Signed)
Pt states she is having contractions every 5 minutes with bloody show.

## 2010-10-17 NOTE — Progress Notes (Signed)
   Delivery Note  At  a viable female named Laurie Choi was delivered via  (Presentation: LOA  ).  APGAR: 9,9 . Weight Placenta status: , .  Cord:  with the following complications: Marland Kitchen Mouth and nose suctioned at perineum. NICU team present at delivery  Anesthesia:  Epidural   Episiotomy: none Lacerations: none Suture Repair: N/A Est. Blood Loss (mL): 350  Mom to postpartum.  Baby to nursery-stable.  [name] A 10/17/2010, 8:02 PM

## 2010-10-17 NOTE — Progress Notes (Signed)
Called me to Rm c c/o chest tightness & L facial tingling. Level checked. Decreased sensitivity to cold on L side up to shoulders.  Able to take adequate breaths.  HOB elevated to high-fowler's.  O2 @ 10L/min per NRB.  Dr Hatchett's phone line busy.  Notified Pat Patrick, CRNA  - he will update Dr Arby Barrette & ask him to come evaluate.

## 2010-10-17 NOTE — Progress Notes (Signed)
Dr Hatchett's phone line still busy.  Called E. Shaffmaster, Scientist, clinical (histocompatibility and immunogenetics).  Asked him to come evaluate.  Order rec'd to Turn epidural pump off.

## 2010-10-17 NOTE — Progress Notes (Signed)
S: Better with epidural. SROM at 16:15 with thick meconium  O: Contractions every 3-4 minutes, strong, 60-70 seconds, 9/10      FHR tracings reviewed and reassuring      Cervix 8/100/0 station  A: normal labor progress     Meconium stained fluid  P: IUPC easily inserted to start amnioinusion after consent obtained     SVD expected

## 2010-10-17 NOTE — Progress Notes (Signed)
Dr Arby Barrette returned call.  Instructed to leave epidural off.

## 2010-10-17 NOTE — Progress Notes (Signed)
PT TRANSFERRED TO ROOM 166 VIA WC

## 2010-10-17 NOTE — Anesthesia Procedure Notes (Signed)
Epidural Patient location during procedure: OB Start time: 10/17/2010 5:08 PM End time: 10/17/2010 5:19 PM Reason for block: procedure for pain  Staffing Anesthesiologist: Sandrea Hughs Performed by: anesthesiologist   Preanesthetic Checklist Completed: patient identified, site marked, surgical consent, pre-op evaluation, timeout performed, IV checked, risks and benefits discussed and monitors and equipment checked  Epidural Patient position: sitting Prep: site prepped and draped and DuraPrep Patient monitoring: continuous pulse ox and blood pressure Approach: midline Injection technique: LOR air  Needle:  Needle type: Tuohy  Needle gauge: 17 G Needle length: 9 cm Needle insertion depth: 6 cm Catheter type: closed end flexible Catheter size: 19 Gauge Catheter at skin depth: 11 cm Test dose: negative  Assessment Sensory level: T8 Events: blood not aspirated, injection not painful, no injection resistance, negative IV test and no paresthesia

## 2010-10-18 LAB — CBC
HCT: 30.3 % — ABNORMAL LOW (ref 36.0–46.0)
Hemoglobin: 10.1 g/dL — ABNORMAL LOW (ref 12.0–15.0)
MCH: 30.1 pg (ref 26.0–34.0)
MCHC: 33.3 g/dL (ref 30.0–36.0)

## 2010-10-18 NOTE — Progress Notes (Signed)
Encounter addended by: Len Blalock on: 10/18/2010  3:34 PM<BR>     Documentation filed: Notes Section

## 2010-10-18 NOTE — Progress Notes (Signed)
Post Partum Day 1 Subjective: no complaints, Ambulating and Voiding without diff. Afebrile, VSS. Minimal c/o of Uterine cramping pain with breastfeeding Breastfeeding well established Does not desire early d/c today  Objective: Blood pressure 97/61, pulse 98, temperature 97.9 F (36.6 C), temperature source Oral, resp. rate 20, height 5\' 3"  (1.6 m), weight 79.833 kg (176 lb), SpO2 98.00%, unknown if currently breastfeeding.  Physical Exam:  General: alert, cooperative and no distress Lochia: appropriate Uterine Fundus: firm Incision: n/a DVT Evaluation: No evidence of DVT seen on physical exam.   Basename 10/18/10 0529 10/17/10 1505  HGB 10.1* 12.4  HCT 30.3* 36.9    Assessment/Plan: Plan for discharge tomorrow and Breastfeeding   LOS: 1 day   Laurie Choi 10/18/2010, 7:48 AM

## 2010-10-18 NOTE — Anesthesia Postprocedure Evaluation (Signed)
  Anesthesia Post-op Note  Patient: Laurie Choi  Procedure(s) Performed: * No procedures listed *  Patient Location: PACU and Women's Unit  Anesthesia Type: Epidural  Level of Consciousness: awake, alert , oriented and patient cooperative  Airway and Oxygen Therapy: Patient Spontanous Breathing  Post-op Pain: none  Post-op Assessment: Patient's Cardiovascular Status Stable and Respiratory Function Stable  Post-op Vital Signs: stable  Complications: symptoms of total block; labor epidural turned off and allowed to wear down, no sequela

## 2010-10-19 MED ORDER — IBUPROFEN 600 MG PO TABS
600.0000 mg | ORAL_TABLET | Freq: Four times a day (QID) | ORAL | Status: DC | PRN
  Filled 2010-10-19: qty 1

## 2010-10-19 MED ORDER — IBUPROFEN 600 MG PO TABS: 600.0000 mg | ORAL_TABLET | Freq: Four times a day (QID) | ORAL | Status: AC

## 2010-10-19 MED ORDER — COMPLETENATE 29-1 MG PO CHEW
1.0000 | CHEWABLE_TABLET | Freq: Every day | ORAL | Status: DC
  Filled 2010-10-19: qty 1

## 2010-10-19 MED ORDER — PRENATAL PLUS 27-1 MG PO TABS
1.0000 | ORAL_TABLET | Freq: Every day | ORAL | Status: DC
  Administered 2010-10-19: 1 via ORAL
  Filled 2010-10-19: qty 1

## 2010-10-19 NOTE — Progress Notes (Signed)
Post Partum Day 2  Subjective: no complaints, up ad lib, voiding, tolerating PO, + flatus and tired today, baby didn't sleep overnight, mood stable, bonding well  Objective: Blood pressure 127/83, pulse 92, temperature 98.7 F (37.1 C), temperature source Oral, resp. rate 18, height 5\' 3"  (1.6 m), weight 79.833 kg (176 lb), SpO2 98.00%, unknown if currently breastfeeding.  Physical Exam:  General: alert, cooperative, fatigued and no distress Lochia: appropriate Uterine Fundus: firm Incision: healing well DVT Evaluation: No evidence of DVT seen on physical exam. Negative Homan's sign. No cords or calf tenderness.   Basename 10/18/10 0529 10/17/10 1505  HGB 10.1* 12.4  HCT 30.3* 36.9    Assessment/Plan: Discharge home, Breastfeeding and Contraception plans IUD, R/B/S explained, including other options.   LOS: 2 days   Shritha Bresee M 10/19/2010, 8:00 AM

## 2010-10-19 NOTE — Discharge Summary (Signed)
  Obstetric Discharge Summary Reason for Admission: onset of labor Prenatal Procedures: ultrasound Intrapartum Procedures: spontaneous vaginal delivery Postpartum Procedures: none Complications-Operative and Postpartum: None  Temp:  [98.4 F (36.9 C)-98.8 F (37.1 C)] 98.7 F (37.1 C) (08/19 0530) Pulse Rate:  [92-96] 92  (08/19 0530) Resp:  [18] 18  (08/19 0530) BP: (110-127)/(71-83) 127/83 mmHg (08/19 0530) SpO2:  [98 %] 98 % (08/18 1950) Hemoglobin  Date Value Range Status  10/18/2010 10.1* 12.0-15.0 (g/dL) Final     DELTA CHECK NOTED     REPEATED TO VERIFY     HCT  Date Value Range Status  10/18/2010 30.3* 36.0-46.0 (%) Final    Hospital Course:  Pt arrived with contractions in active labor and was admitted to birthing suites. Pt did receive a labor epidural, which she became groggy and weak with, vital signs remained stable and epidural was turned off and pt regained strength and no further difficulty was noted with epidural. There was thick meconium with AROM and an IUPC was placed for amnioinfusion. NICU team attended delivery, however infant cried and only bulb suction was needed. Pt has then continued to follow a routine pp course, without any complications. She has remained stable and is ready for discharge.   Discharge Diagnoses: Term Pregnancy-delivered  Discharge Information: Date: 10/19/2010 Activity: pelvic rest Diet: routine Medications:  Medication List  As of 10/19/2010  8:06 AM   START taking these medications         ibuprofen 600 MG tablet   Commonly known as: ADVIL,MOTRIN   Take 1 tablet (600 mg total) by mouth every 6 (six) hours.          Where to get your medications    These are the prescriptions that you need to pick up.   You may get these medications from any pharmacy.         ibuprofen 600 MG tablet           Condition: stable Instructions: refer to practice specific booklet Discharge to: home Follow-up Information    Follow up  with LATHAM,VICKI L. Make an appointment in 5 weeks.   Contact information:   [address], [address] [place] [address] [phone]          Newborn Data: Live born  Information for the patient's newborn:  Donnamarie, Shankles [[id number]]  female ; APGAR 9, 9  , ; weight 7#6oz Home with mother.  Johne Buckle M 10/19/2010, 8:06 AM

## 2010-10-20 NOTE — Progress Notes (Signed)
UR chart review completed.  

## 2012-11-09 IMAGING — US US OB COMP LESS 14 WK
1 series · 14 of 27 positions shown · non-contrast
Comparison: None.

CLINICAL DATA: Pregnant female with cramping.

OBSTETRIC <14 WK ULTRASOUND
TECHNIQUE: Transabdominal ultrasound was performed for evaluation
of the gestation as well as the maternal uterus and adnexal
regions.

[Series 1: us ob comp less 14 wk · 14 of 27 slices shown]
[im 1/27]
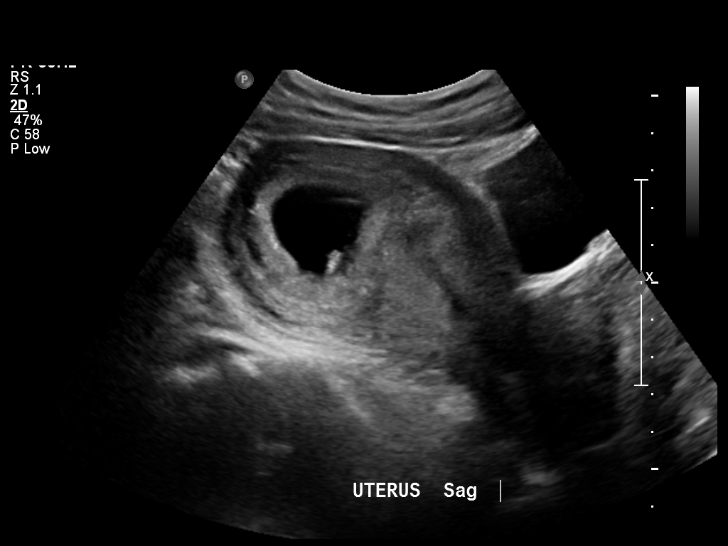
[im 3/27]
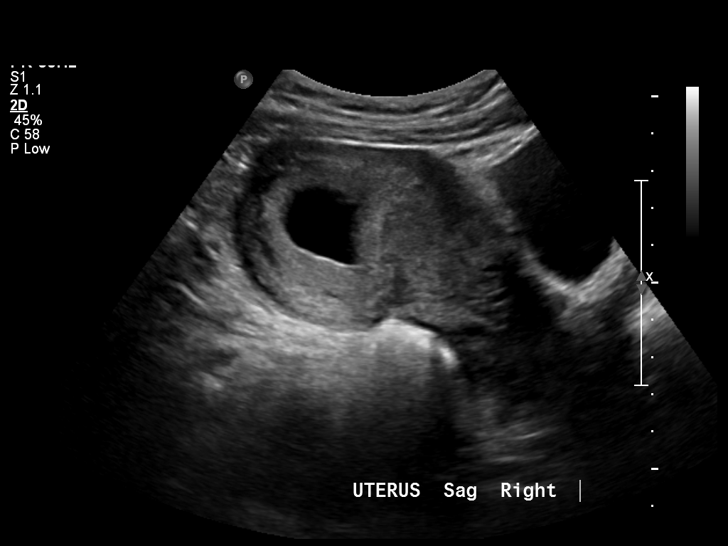
[im 5/27]
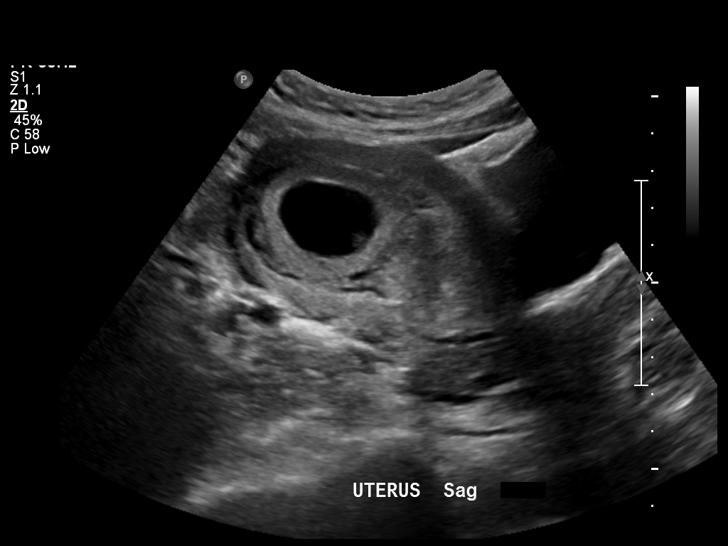
[im 7/27]
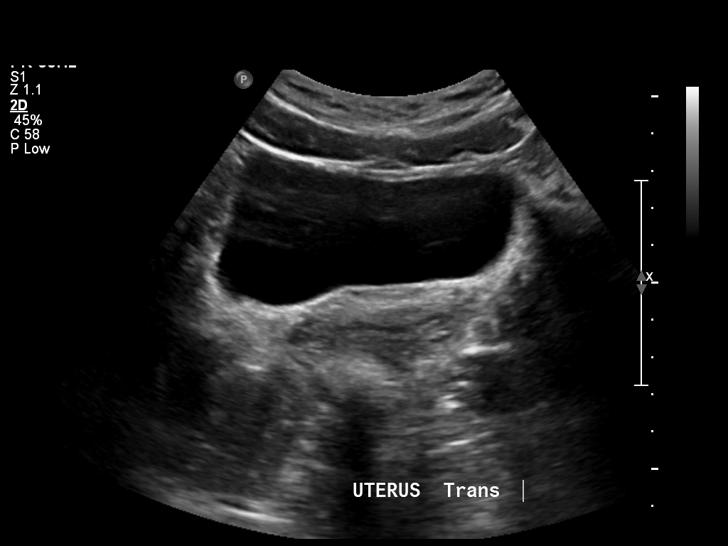
[im 9/27]
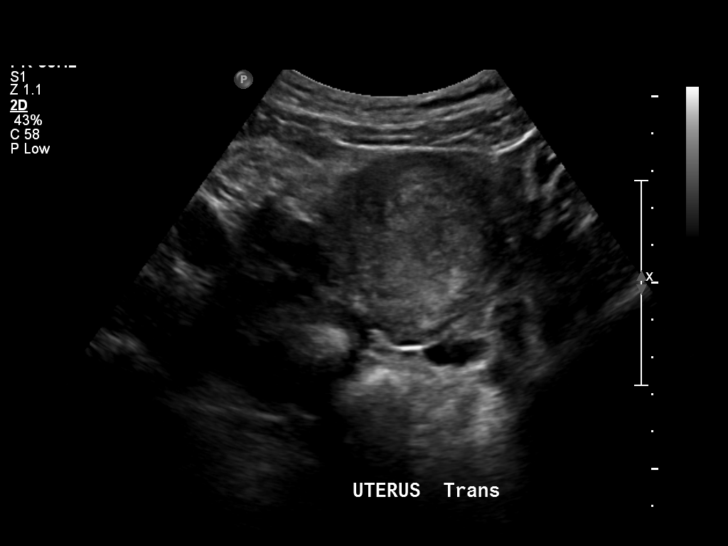
[im 11/27]
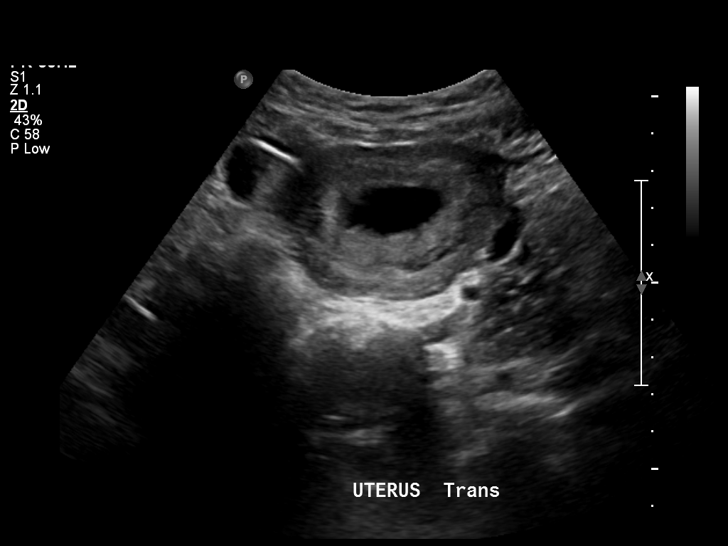
[im 13/27]
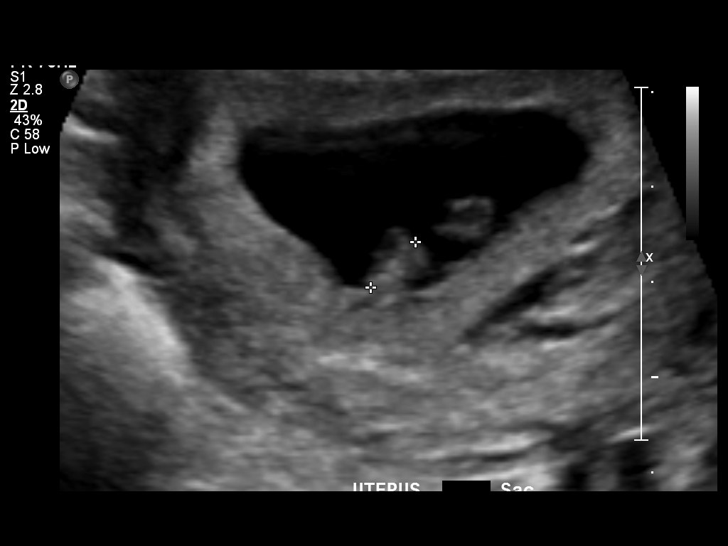
[im 15/27]
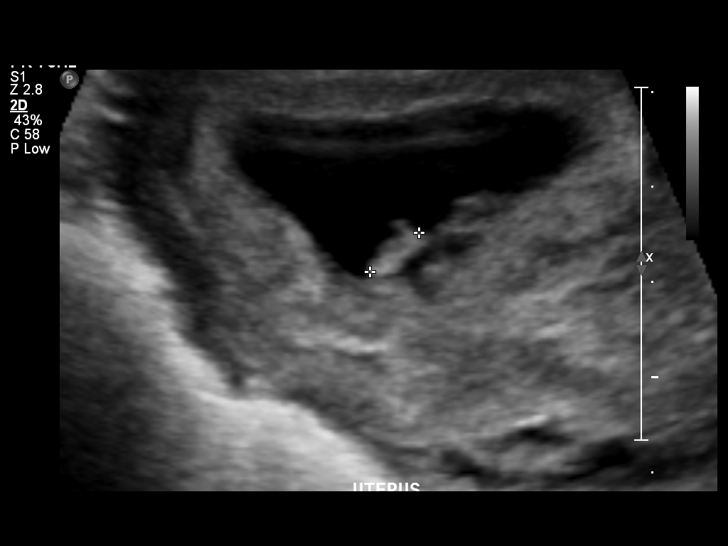
[im 17/27]
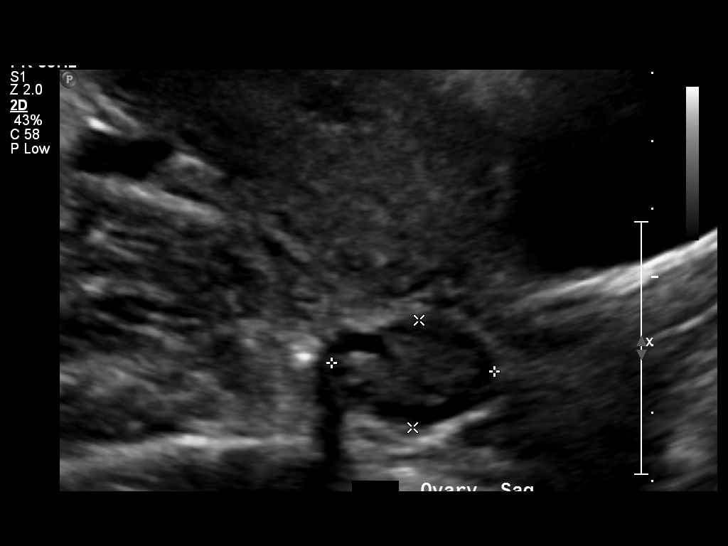
[im 19/27]
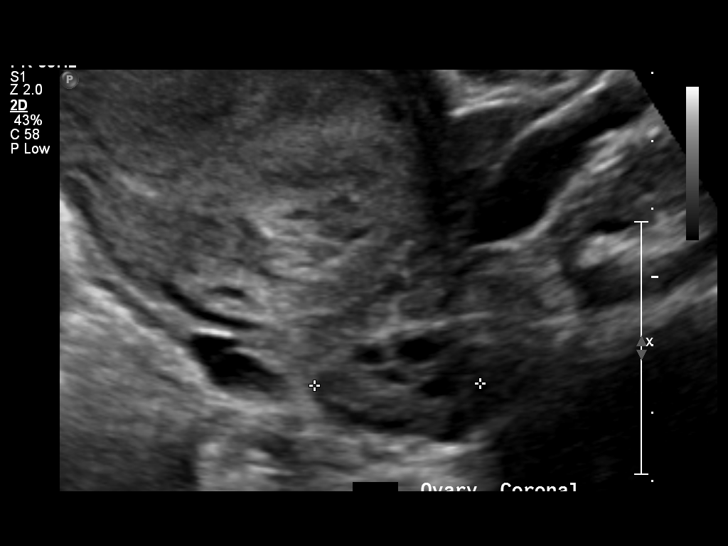
[im 21/27]
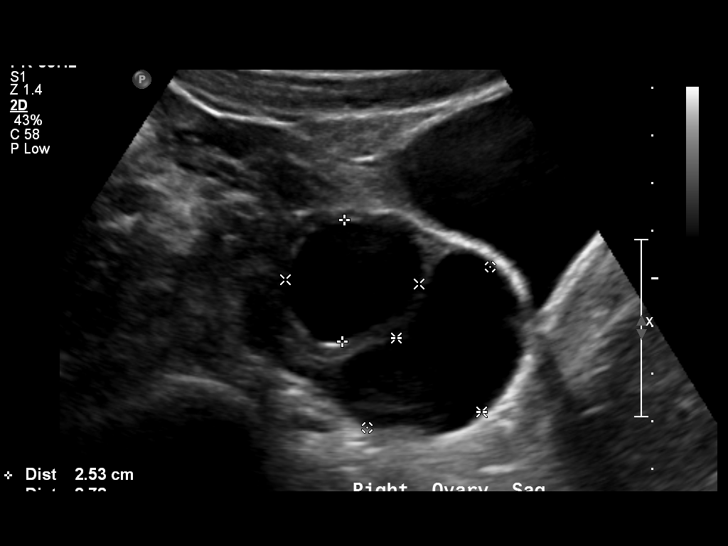
[im 23/27]
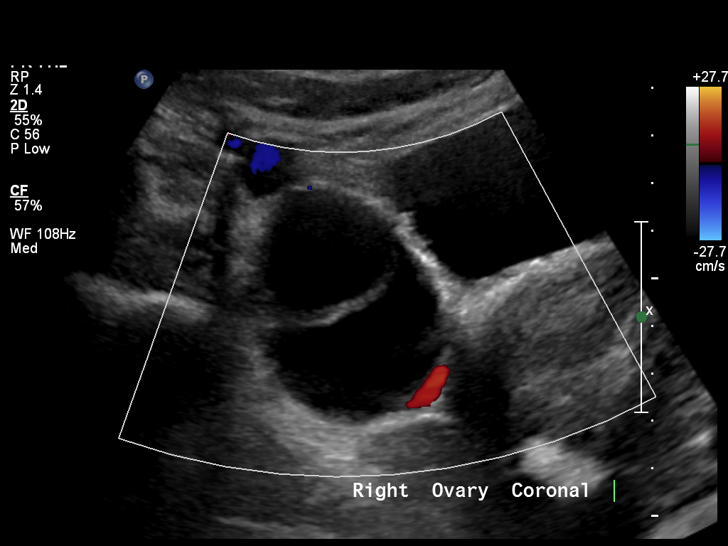
[im 25/27]
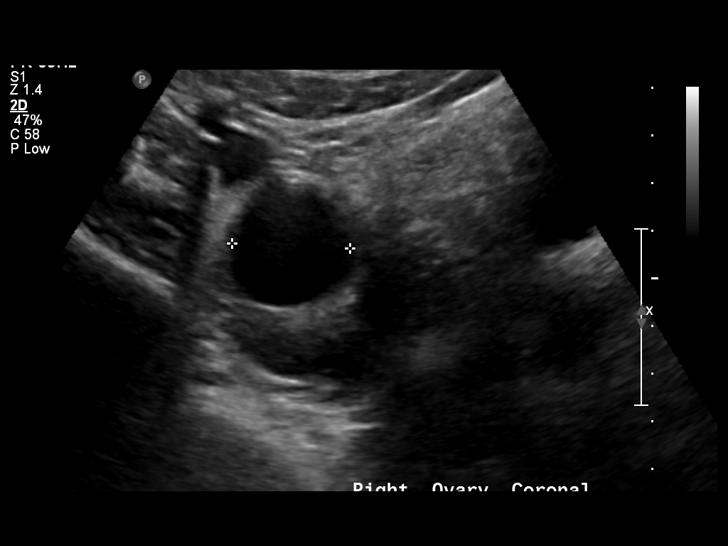
[im 27/27]
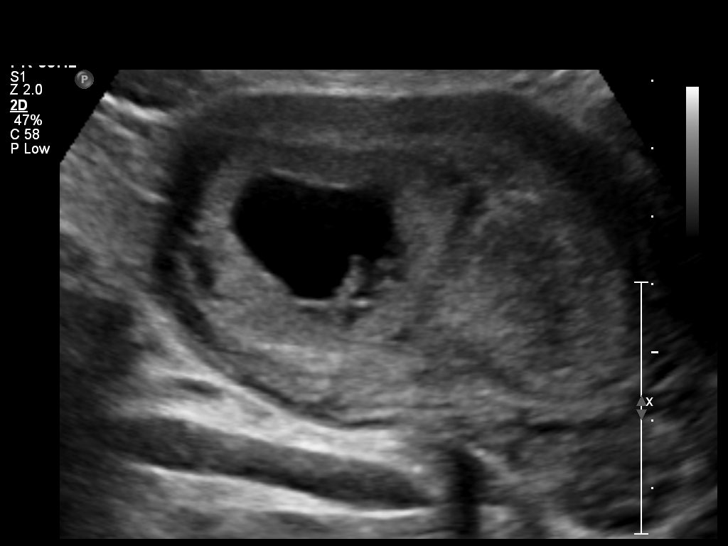

[14 of 27 positions shown; findings below may reference images not displayed]

Intrauterine gestational sac: Visualized/normal in shape.
Yolk sac: Visualized.
Embryo: Visualized.
Cardiac Activity: Detected.
Heart Rate: 121 bpm

CRL:  6.7 mm  6w  4d          US EDC: 10/19/2010

Maternal uterus/Adnexae:
Two simple right ovarian cysts identified.  One cyst measures 2.8 x
2.5 x 3.8 cm and the second measures 4.2 x 2.4 x 2.5 cm.
IMPRESSION: 1.  Single living intrauterine pregnancy.
2.  Two simple right ovarian cysts.

## 2014-01-01 ENCOUNTER — Encounter (HOSPITAL_COMMUNITY): Payer: Self-pay | Admitting: *Deleted

## 2014-04-26 ENCOUNTER — Encounter (HOSPITAL_COMMUNITY): Payer: Self-pay | Admitting: *Deleted

## 2014-04-26 ENCOUNTER — Emergency Department (HOSPITAL_COMMUNITY)
Admission: EM | Admit: 2014-04-26 | Discharge: 2014-04-27 | Disposition: A | Discharge status: Home / Self Care | Payer: Medicaid Other | Attending: Emergency Medicine | Admitting: Emergency Medicine

## 2014-04-26 DIAGNOSIS — R42 Dizziness and giddiness: Secondary | ICD-10-CM

## 2014-04-26 DIAGNOSIS — J069 Acute upper respiratory infection, unspecified: Secondary | ICD-10-CM | POA: Insufficient documentation

## 2014-04-26 DIAGNOSIS — Z3202 Encounter for pregnancy test, result negative: Secondary | ICD-10-CM | POA: Insufficient documentation

## 2014-04-26 DIAGNOSIS — R0602 Shortness of breath: Secondary | ICD-10-CM

## 2014-04-26 DIAGNOSIS — R059 Cough, unspecified: Secondary | ICD-10-CM

## 2014-04-26 DIAGNOSIS — R05 Cough: Secondary | ICD-10-CM

## 2014-04-26 NOTE — ED Notes (Signed)
Pt states that she has felt short of breath, dizzy, "fuzzy" vision and a cough since Sunday; pt states that she becomes more short of breath upon exertion; pt denies chest pain; pt states that her daughters have Strep and she may have that

## 2014-04-27 ENCOUNTER — Emergency Department (HOSPITAL_COMMUNITY): Payer: Medicaid Other

## 2014-04-27 LAB — CBC WITH DIFFERENTIAL/PLATELET
BASOS PCT: 0 % (ref 0–1)
Basophils Absolute: 0 10*3/uL (ref 0.0–0.1)
EOS PCT: 1 % (ref 0–5)
Eosinophils Absolute: 0.1 10*3/uL (ref 0.0–0.7)
HEMATOCRIT: 39.2 % (ref 36.0–46.0)
HEMOGLOBIN: 13 g/dL (ref 12.0–15.0)
LYMPHS ABS: 2.4 10*3/uL (ref 0.7–4.0)
Lymphocytes Relative: 32 % (ref 12–46)
MCH: 29.7 pg (ref 26.0–34.0)
MCHC: 33.2 g/dL (ref 30.0–36.0)
MCV: 89.7 fL (ref 78.0–100.0)
MONOS PCT: 9 % (ref 3–12)
Monocytes Absolute: 0.7 10*3/uL (ref 0.1–1.0)
NEUTROS ABS: 4.2 10*3/uL (ref 1.7–7.7)
Neutrophils Relative %: 58 % (ref 43–77)
Platelets: 221 10*3/uL (ref 150–400)
RBC: 4.37 MIL/uL (ref 3.87–5.11)
RDW: 13.9 % (ref 11.5–15.5)
WBC Morphology: INCREASED
WBC: 7.4 10*3/uL (ref 4.0–10.5)

## 2014-04-27 LAB — BASIC METABOLIC PANEL
Anion gap: 5 (ref 5–15)
BUN: 11 mg/dL (ref 6–23)
CHLORIDE: 106 mmol/L (ref 96–112)
CO2: 28 mmol/L (ref 19–32)
Calcium: 8.8 mg/dL (ref 8.4–10.5)
Creatinine, Ser: 0.71 mg/dL (ref 0.50–1.10)
GFR calc Af Amer: >90 mL/min (ref >90)
GFR calc non Af Amer: >90 mL/min (ref >90)
GLUCOSE: 114 mg/dL — AB (ref 70–99)
POTASSIUM: 3.4 mmol/L — AB (ref 3.5–5.1)
Sodium: 139 mmol/L (ref 135–145)

## 2014-04-27 LAB — URINALYSIS, ROUTINE W REFLEX MICROSCOPIC
GLUCOSE, UA: NEGATIVE mg/dL
Ketones, ur: NEGATIVE mg/dL
Nitrite: NEGATIVE
PH: 6 (ref 5.0–8.0)
Protein, ur: 30 mg/dL — AB
SPECIFIC GRAVITY, URINE: 1.034 — AB (ref 1.005–1.030)
UROBILINOGEN UA: 1 mg/dL (ref 0.0–1.0)

## 2014-04-27 LAB — URINE MICROSCOPIC-ADD ON

## 2014-04-27 LAB — POC URINE PREG, ED: Preg Test, Ur: NEGATIVE

## 2014-04-27 LAB — RAPID STREP SCREEN (MED CTR MEBANE ONLY): Streptococcus, Group A Screen (Direct): NEGATIVE

## 2014-04-27 MED ORDER — MECLIZINE HCL 25 MG PO TABS
25.0000 mg | ORAL_TABLET | Freq: Once | ORAL | Status: AC
  Administered 2014-04-27: 25 mg via ORAL
  Filled 2014-04-27: qty 1

## 2014-04-27 MED ORDER — ALBUTEROL SULFATE HFA 108 (90 BASE) MCG/ACT IN AERS
2.0000 | INHALATION_SPRAY | RESPIRATORY_TRACT | Status: DC | PRN
  Administered 2014-04-27: 2 via RESPIRATORY_TRACT
  Filled 2014-04-27: qty 6.7

## 2014-04-27 MED ORDER — MECLIZINE HCL 50 MG PO TABS: 50.0000 mg | ORAL_TABLET | Freq: Three times a day (TID) | ORAL | Status: DC | PRN

## 2014-04-27 MED ORDER — NAPROXEN 500 MG PO TABS: 500.0000 mg | ORAL_TABLET | Freq: Two times a day (BID) | ORAL | Status: DC

## 2014-04-27 NOTE — ED Notes (Signed)
PT ambulated to BR and back without difficulty; pt steady on her feet and no assistance required

## 2014-04-27 NOTE — Discharge Instructions (Signed)
Continue to stay well-hydrated. Gargle warm salt water and spit it out. Continue to alternate between Tylenol and Naprosyn for pain or fever. Use Mucinex for cough suppression/expectoration of mucus. Use netipot and flonase to help with nasal congestion. Use meclizine for dizziness as directed. Followup with your primary care doctor in 5-7 days for recheck of ongoing symptoms, use the list below to find a regular doctor. Return to emergency department for emergent changing or worsening of symptoms.   Upper Respiratory Infection, Adult An upper respiratory infection (URI) is also sometimes known as the common cold. The upper respiratory tract includes the nose, sinuses, throat, trachea, and bronchi. Bronchi are the airways leading to the lungs. Most people improve within 1 week, but symptoms can last up to 2 weeks. A residual cough may last even longer.  CAUSES Many different viruses can infect the tissues lining the upper respiratory tract. The tissues become irritated and inflamed and often become very moist. Mucus production is also common. A cold is contagious. You can easily spread the virus to others by oral contact. This includes kissing, sharing a glass, coughing, or sneezing. Touching your mouth or nose and then touching a surface, which is then touched by another person, can also spread the virus. SYMPTOMS  Symptoms typically develop 1 to 3 days after you come in contact with a cold virus. Symptoms vary from person to person. They may include:  Runny nose.  Sneezing.  Nasal congestion.  Sinus irritation.  Sore throat.  Loss of voice (laryngitis).  Cough.  Fatigue.  Muscle aches.  Loss of appetite.  Headache.  Low-grade fever. DIAGNOSIS  You might diagnose your own cold based on familiar symptoms, since most people get a cold 2 to 3 times a year. Your caregiver can confirm this based on your exam. Most importantly, your caregiver can check that your symptoms are not due to  another disease such as strep throat, sinusitis, pneumonia, asthma, or epiglottitis. Blood tests, throat tests, and X-rays are not necessary to diagnose a common cold, but they may sometimes be helpful in excluding other more serious diseases. Your caregiver will decide if any further tests are required. RISKS AND COMPLICATIONS  You may be at risk for a more severe case of the common cold if you smoke cigarettes, have chronic heart disease (such as heart failure) or lung disease (such as asthma), or if you have a weakened immune system. The very young and very old are also at risk for more serious infections. Bacterial sinusitis, middle ear infections, and bacterial pneumonia can complicate the common cold. The common cold can worsen asthma and chronic obstructive pulmonary disease (COPD). Sometimes, these complications can require emergency medical care and may be life-threatening. PREVENTION  The best way to protect against getting a cold is to practice good hygiene. Avoid oral or hand contact with people with cold symptoms. Wash your hands often if contact occurs. There is no clear evidence that vitamin C, vitamin E, echinacea, or exercise reduces the chance of developing a cold. However, it is always recommended to get plenty of rest and practice good nutrition. TREATMENT  Treatment is directed at relieving symptoms. There is no cure. Antibiotics are not effective, because the infection is caused by a virus, not by bacteria. Treatment may include:  Increased fluid intake. Sports drinks offer valuable electrolytes, sugars, and fluids.  Breathing heated mist or steam (vaporizer or shower).  Eating chicken soup or other clear broths, and maintaining good nutrition.  Getting plenty of  rest.  Using gargles or lozenges for comfort.  Controlling fevers with ibuprofen or acetaminophen as directed by your caregiver.  Increasing usage of your inhaler if you have asthma. Zinc gel and zinc lozenges,  taken in the first 24 hours of the common cold, can shorten the duration and lessen the severity of symptoms. Pain medicines may help with fever, muscle aches, and throat pain. A variety of non-prescription medicines are available to treat congestion and runny nose. Your caregiver can make recommendations and may suggest nasal or lung inhalers for other symptoms.  HOME CARE INSTRUCTIONS   Only take over-the-counter or prescription medicines for pain, discomfort, or fever as directed by your caregiver.  Use a warm mist humidifier or inhale steam from a shower to increase air moisture. This may keep secretions moist and make it easier to breathe.  Drink enough water and fluids to keep your urine clear or pale yellow.  Rest as needed.  Return to work when your temperature has returned to normal or as your caregiver advises. You may need to stay home longer to avoid infecting others. You can also use a face mask and careful hand washing to prevent spread of the virus. SEEK MEDICAL CARE IF:   After the first few days, you feel you are getting worse rather than better.  You need your caregiver's advice about medicines to control symptoms.  You develop chills, worsening shortness of breath, or brown or red sputum. These may be signs of pneumonia.  You develop yellow or brown nasal discharge or pain in the face, especially when you bend forward. These may be signs of sinusitis.  You develop a fever, swollen neck glands, pain with swallowing, or white areas in the back of your throat. These may be signs of strep throat. SEEK IMMEDIATE MEDICAL CARE IF:   You have a fever.  You develop severe or persistent headache, ear pain, sinus pain, or chest pain.  You develop wheezing, a prolonged cough, cough up blood, or have a change in your usual mucus (if you have chronic lung disease).  You develop sore muscles or a stiff neck. Document Released: 08/12/2000 Document Revised: 05/11/2011 Document  Reviewed: 05/24/2013 [facility] Patient Information 2015 [place], [place]. This information is not intended to replace advice given to you by your health care provider. Make sure you discuss any questions you have with your health care provider.  Shortness of Breath Shortness of breath means you have trouble breathing. Shortness of breath needs medical care right away. HOME CARE   Do not smoke.  Avoid being around chemicals or things (paint fumes, dust) that may bother your breathing.  Rest as needed. Slowly begin your normal activities.  Only take medicines as told by your doctor.  Keep all doctor visits as told. GET HELP RIGHT AWAY IF:   Your shortness of breath gets worse.  You feel lightheaded, pass out (faint), or have a cough that is not helped by medicine.  You cough up blood.  You have pain with breathing.  You have pain in your chest, arms, shoulders, or belly (abdomen).  You have a fever.  You cannot walk up stairs or exercise the way you normally do.  You do not get better in the time expected.  You have a hard time doing normal activities even with rest.  You have problems with your medicines.  You have any new symptoms. MAKE SURE YOU:  Understand these instructions.  Will watch your condition.  Will get help right  away if you are not doing well or get worse. Document Released: 08/05/2007 Document Revised: 02/21/2013 Document Reviewed: 05/04/2011 [facility] Patient Information 2015 [place], [place]. This information is not intended to replace advice given to you by your health care provider. Make sure you discuss any questions you have with your health care provider.  Dizziness  Dizziness means you feel unsteady or lightheaded. You might feel like you are going to pass out (faint). HOME CARE   Drink enough fluids to keep your pee (urine) clear or pale yellow.  Take your medicines exactly as told by your doctor. If you take blood pressure medicine, always  stand up slowly from the lying or sitting position. Hold on to something to steady yourself.  If you need to stand in one place for a long time, move your legs often. Tighten and relax your leg muscles.  Have someone stay with you until you feel okay.  Do not drive or use heavy machinery if you feel dizzy.  Do not drink alcohol. GET HELP RIGHT AWAY IF:   You feel dizzy or lightheaded and it gets worse.  You feel sick to your stomach (nauseous), or you throw up (vomit).  You have trouble talking or walking.  You feel weak or have trouble using your arms, hands, or legs.  You cannot think clearly or have trouble forming sentences.  You have chest pain, belly (abdominal) pain, sweating, or you are short of breath.  Your vision changes.  You are bleeding.  You have problems from your medicine that seem to be getting worse. MAKE SURE YOU:   Understand these instructions.  Will watch your condition.  Will get help right away if you are not doing well or get worse. Document Released: 02/05/2011 Document Revised: 05/11/2011 Document Reviewed: 02/05/2011 [facility] Patient Information 2015 [place], [place]. This information is not intended to replace advice given to you by your health care provider. Make sure you discuss any questions you have with your health care provider. Emergency Department Resource Guide 1) Find a Doctor and Pay Out of Pocket Although you won't have to find out who is covered by your insurance plan, it is a good idea to ask around and get recommendations. You will then need to call the office and see if the doctor you have chosen will accept you as a new patient and what types of options they offer for patients who are self-pay. Some doctors offer discounts or will set up payment plans for their patients who do not have insurance, but you will need to ask so you aren't surprised when you get to your appointment.  2) Contact Your Local Health Department Not all  health departments have doctors that can see patients for sick visits, but many do, so it is worth a call to see if yours does. If you don't know where your local health department is, you can check in your phone book. The CDC also has a tool to help you locate your state's health department, and many state websites also have listings of all of their local health departments.  3) Find a [facility] If your illness is not likely to be very severe or complicated, you may want to try a walk in clinic. These are popping up all over the country in pharmacies, drugstores, and shopping centers. They're usually staffed by nurse practitioners or physician assistants that have been trained to treat common illnesses and complaints. They're usually fairly quick and inexpensive. However, if you have serious  medical issues or chronic medical problems, these are probably not your best option.  No Primary Care Doctor: - Call Health Connect at  [phone] - they can help you locate a primary care doctor that  accepts your insurance, provides certain services, etc. - Physician Referral Service- [phone]  Chronic Pain Problems: Organization         Address  Phone   Notes  [facility]  [phone] Patients need to be referred by their primary care doctor.   Medication Assistance: Organization         Address  Phone   Notes  [facility] Medication [facility] [address] [place], [place] [address] [phone] --Must be a resident of [facility] -- Must have NO insurance coverage whatsoever (no Medicaid/ Medicare, etc.) -- The pt. MUST have a primary care doctor that directs their care regularly and follows them in the community   [facility]  [phone]   [employer]  [phone]    Agencies that provide inexpensive medical care: Organization         Address  Phone   Notes  [place]  [phone]   [facility]     [phone]   [facility] [place],  [phone]   [place] [address] [place] [phone]   [facility]    [phone]   [facility]    [phone]   [place] [address], [place] Phone:  [phone], Fax:  [phone] Hours of Operation:  9 am - 6 pm, M-F.  Also accepts Medicaid/Medicare and self-pay.  [facility] [place], [address], [place] Phone: [phone], Fax: [phone]. Hours of Operation:  8:30 am - 5:30 pm, M-F.  Also accepts Medicaid and self-pay.  [facility] [address] [place] Phone: [phone]   [place], [place], [place] [phone], Ext. [phone] Mondays & Thursdays: 7-9 AM.  First 15 patients are seen on a first come, first serve basis.    [place] Providers:  Organization         Address  Phone   Notes  [facility] [address], [place] [phone] Also accepts self-pay patients.  [facility] [address], [place]  [phone]   [place], [address], [place] [phone]   [facility] [address], [place] [phone]   Lucianne Lei [address], [place]   [phone] Only accepts [place] [facility] [place] patients after they have their name applied to their card.   Self-Pay (no insurance) in [facility]:  Organization         Address  Phone   Notes  Sickle Cell Patients, [facility] [place] [phone]   [facility] [place] [phone]   [facility] [place]  [place], [place], [place] [phone]     [facility]/Dr. Osei-Bonsu  [address] [place] [address], [place] [phone] Phone number for both [place] and [place] locations  is the same.  Urgent Medical and [facility] [address], [place] [phone]   [facility] [address] [place] or [address] [phone]   [facility] [address] [place] [phone], phone; [phone], fax Sees patients 1st and 3rd Saturday of every month.  Must not qualify for public or private insurance (i.e. Medicaid, Medicare, [employer] Health Choice, Veterans' Benefits)  Household income should be no more than 200% of the poverty level The clinic cannot treat you if you are pregnant or think you are pregnant  Sexually transmitted diseases are not treated at the clinic.

## 2014-04-27 NOTE — ED Notes (Signed)
Patient transported to X-ray 

## 2014-04-27 NOTE — ED Provider Notes (Signed)
Care assumed at shift change from [facility] [place], [place]. Plan to f/up with U/A then discharge.   Physical Exam  BP 127/70 mmHg  Pulse 101  Temp(Src) 99.3 F (37.4 C) (Axillary)  Resp 18  Ht 5\' 2"  (1.575 m)  Wt 206 lb (93.441 kg)  BMI 37.67 kg/m2  SpO2 96%  LMP 04/20/2014  Physical Exam Gen: afebrile, VSS, NAD HEENT: EOMI, MMM Resp: no resp distress CV: rate WNL Abd: Soft, NTND, +BS throughout, no r/g/r, neg murphy's, neg mcburney's, no CVA TTP  MsK: moving all extremities with ease Neuro: A&O x4  ED Course  Procedures Results for orders placed or performed during the hospital encounter of 04/26/14  Rapid strep screen  Result Value Ref Range   Streptococcus, Group A Screen (Direct) NEGATIVE NEGATIVE  CBC with Differential  Result Value Ref Range   WBC 7.4 4.0 - 10.5 K/uL   RBC 4.37 3.87 - 5.11 MIL/uL   Hemoglobin 13.0 12.0 - 15.0 g/dL   HCT 39.2 36.0 - 46.0 %   MCV 89.7 78.0 - 100.0 fL   MCH 29.7 26.0 - 34.0 pg   MCHC 33.2 30.0 - 36.0 g/dL   RDW 13.9 11.5 - 15.5 %   Platelets 221 150 - 400 K/uL   Neutrophils Relative % 58 43 - 77 %   Lymphocytes Relative 32 12 - 46 %   Monocytes Relative 9 3 - 12 %   Eosinophils Relative 1 0 - 5 %   Basophils Relative 0 0 - 1 %   Neutro Abs 4.2 1.7 - 7.7 K/uL   Lymphs Abs 2.4 0.7 - 4.0 K/uL   Monocytes Absolute 0.7 0.1 - 1.0 K/uL   Eosinophils Absolute 0.1 0.0 - 0.7 K/uL   Basophils Absolute 0.0 0.0 - 0.1 K/uL   WBC Morphology INCREASED BANDS (>20% BANDS)   Basic metabolic panel  Result Value Ref Range   Sodium 139 135 - 145 mmol/L   Potassium 3.4 (L) 3.5 - 5.1 mmol/L   Chloride 106 96 - 112 mmol/L   CO2 28 19 - 32 mmol/L   Glucose, Bld 114 (H) 70 - 99 mg/dL   BUN 11 6 - 23 mg/dL   Creatinine, Ser 0.71 0.50 - 1.10 mg/dL   Calcium 8.8 8.4 - 10.5 mg/dL   GFR calc non Af Amer >90 >90 mL/min   GFR calc Af Amer >90 >90 mL/min   Anion gap 5 5 - 15  Urinalysis, Routine w reflex microscopic  Result Value Ref Range   Color, Urine  AMBER (A) YELLOW   APPearance CLOUDY (A) CLEAR   Specific Gravity, Urine 1.034 (H) 1.005 - 1.030   pH 6.0 5.0 - 8.0   Glucose, UA NEGATIVE NEGATIVE mg/dL   Hgb urine dipstick LARGE (A) NEGATIVE   Bilirubin Urine SMALL (A) NEGATIVE   Ketones, ur NEGATIVE NEGATIVE mg/dL   Protein, ur 30 (A) NEGATIVE mg/dL   Urobilinogen, UA 1.0 0.0 - 1.0 mg/dL   Nitrite NEGATIVE NEGATIVE   Leukocytes, UA SMALL (A) NEGATIVE  Urine microscopic-add on  Result Value Ref Range   Squamous Epithelial / LPF FEW (A) RARE   WBC, UA 3-6 <3 WBC/hpf   Crystals CA OXALATE CRYSTALS (A) NEGATIVE   Urine-Other MUCOUS PRESENT   POC urine preg, ED (not at [facility])  Result Value Ref Range   Preg Test, Ur NEGATIVE NEGATIVE   Dg Chest 2 View  04/27/2014   CLINICAL DATA:  Shortness of breath, weakness and chest tightness for  24 hours.  EXAM: CHEST  2 VIEW  COMPARISON:  Chest radiograph March 17, 2009  FINDINGS: Cardiomediastinal silhouette is unremarkable. Curvilinear calcification on the lateral radiograph over the superior aspect of the heart. The lungs are clear without pleural effusions or focal consolidations. Trachea projects midline and there is no pneumothorax. Soft tissue planes and included osseous structures are non-suspicious.  IMPRESSION: No acute cardiopulmonary process.  Mediastinal linear calcification may reflect Coronary artery calcification, pericardial calcification or even artifact.   Electronically Signed   By: Elon Alas   On: 04/27/2014 04:18      Meds ordered this encounter  Medications  . meclizine (ANTIVERT) tablet 25 mg    Sig:   . meclizine (ANTIVERT) 50 MG tablet    Sig: Take 1 tablet (50 mg total) by mouth 3 (three) times daily as needed.    Dispense:  30 tablet    Refill:  0    Order Specific Question:  Supervising Provider    Answer:  Noemi Chapel D [[id number]]  . albuterol (PROVENTIL HFA;VENTOLIN HFA) 108 (90 BASE) MCG/ACT inhaler 2 puff    Sig:   . naproxen (NAPROSYN) 500 MG tablet     Sig: Take 1 tablet (500 mg total) by mouth 2 (two) times daily.    Dispense:  30 tablet    Refill:  0    Order Specific Question:  Supervising Provider    Answer:  Noemi Chapel D [[id number]]     MDM:   ICD-9-CM ICD-10-CM   1. SOB (shortness of breath) 786.05 R06.02   2. Shortness of breath 786.05 R06.02 DG Chest 2 View     DG Chest 2 View  3. Viral URI 465.9 J06.9   4. Cough 786.2 R05   5. Dizziness 780.4 R42     8:16 AM U/A appears contaminated, large Hgb but pt is on menses, doesn't have any bacteria, doubt UTI. No urinary symptoms. Pt clear for d/c with previously discussed plan per Antonietta Breach, including symptomatic control.    [place], [place] 04/27/14 [phone]

## 2014-04-27 NOTE — ED Provider Notes (Signed)
CSN: [id number]     Arrival date & time 04/26/14  2325 History   First MD Initiated Contact with Patient 04/27/14 [phone]     Chief Complaint  Patient presents with  . Shortness of Breath    (Consider location/radiation/quality/duration/timing/severity/associated sxs/prior Treatment) HPI Comments: Patient is a 34 year old female with no significant past medical history who presents to the emergency department for a variety of complaints. Patient states that symptoms began 5 days ago as fatigue and diffuse myalgias. She had a mild sore throat and slept for most of the day which mildly improved her symptoms. Patient developed a cough which has been congested but nonproductive. She also reports feeling dizzy, as though the room is spinning, with ambulation that is prolonged. She has had mild shortness of breath with activity as well as a diffuse chest tightness. Patient reports mild nausea without associated vomiting. She denies any headache at present as well as any nasal congestion, hemoptysis, abdominal pain, diarrhea, dysuria, or hematuria. Patient reports sick contacts in the home as her children have been sick, but are improving; daughter diagnosed with strep throat. Patient denies taking any medications for symptoms prior to arrival.  Patient is a 34 y.o. female presenting with shortness of breath. The history is provided by the patient. No language interpreter was used.  Shortness of Breath Associated symptoms: cough and sore throat   Associated symptoms: no abdominal pain, no chest pain, no fever and no vomiting     Past Medical History  Diagnosis Date  . No pertinent past medical history   . Hyperemesis gravidarum before end of [redacted] week gestation, dehydration    History reviewed. No pertinent past surgical history. No family history on file. History  Substance Use Topics  . Smoking status: Never Smoker   . Smokeless tobacco: Not on file  . Alcohol Use: No   OB History    Gravida  Para Term Preterm AB TAB SAB Ectopic Multiple Living   2 2 1       2       Review of Systems  Constitutional: Positive for activity change and fatigue. Negative for fever.  HENT: Positive for sore throat. Negative for congestion, drooling and trouble swallowing.   Respiratory: Positive for cough, chest tightness and shortness of breath.   Cardiovascular: Negative for chest pain.  Gastrointestinal: Positive for nausea. Negative for vomiting, abdominal pain and diarrhea.  Genitourinary: Negative for dysuria and hematuria.  Neurological: Positive for dizziness. Negative for syncope.  All other systems reviewed and are negative.   Allergies  Review of patient's allergies indicates no known allergies.  Home Medications   Prior to Admission medications   Not on File   BP 134/81 mmHg  Pulse 95  Temp(Src) 99.3 F (37.4 C) (Axillary)  Resp 18  Ht 5\' 2"  (1.575 m)  Wt 206 lb (93.441 kg)  BMI 37.67 kg/m2  SpO2 97%  LMP 04/20/2014   Physical Exam  Constitutional: She is oriented to person, place, and time. She appears well-developed and well-nourished. No distress.  Nontoxic/nonseptic appearing  HENT:  Head: Normocephalic and atraumatic.  Right Ear: Tympanic membrane, external ear and ear canal normal.  Left Ear: Tympanic membrane, external ear and ear canal normal.  Nose: Nose normal.  Mouth/Throat: Uvula is midline, oropharynx is clear and moist and mucous membranes are normal. No oropharyngeal exudate or posterior oropharyngeal edema.  No middle ear effusion or evidence of otitis media. No hemotympanum. Oropharynx clear. Uvula midline. Patient tolerating secretions without difficulty.  Eyes: Conjunctivae and EOM are normal. No scleral icterus.  Neck: Normal range of motion.  No nuchal rigidity or meningismus  Cardiovascular: Normal rate, regular rhythm and normal heart sounds.   Pulmonary/Chest: Effort normal and breath sounds normal. No respiratory distress. She has no wheezes.  She has no rales.  Respirations even and unlabored. There is a congested and nonproductive cough appreciated at bedside. Lungs clear.  Abdominal: Soft. She exhibits no distension. There is no tenderness.  Soft, nontender  Musculoskeletal: Normal range of motion.  Neurological: She is alert and oriented to person, place, and time. No cranial nerve deficit. She exhibits normal muscle tone. Coordination normal.  GCS 15. Speech is goal oriented. Patient answers questions appropriately and follows simple commands. No focal neurologic deficits appreciated. Patient ambulatory in ED with steady gait.  Skin: Skin is warm and dry. No rash noted. She is not diaphoretic. No erythema. No pallor.  Psychiatric: She has a normal mood and affect. Her behavior is normal.  Nursing note and vitals reviewed.   ED Course  Procedures (including critical care time) Labs Review Labs Reviewed  RAPID STREP SCREEN  CULTURE, GROUP A STREP  CBC WITH DIFFERENTIAL/PLATELET  BASIC METABOLIC PANEL  URINALYSIS, ROUTINE W REFLEX MICROSCOPIC  POC URINE PREG, ED    Imaging Review Dg Chest 2 View  04/27/2014   CLINICAL DATA:  Shortness of breath, weakness and chest tightness for 24 hours.  EXAM: CHEST  2 VIEW  COMPARISON:  Chest radiograph March 17, 2009  FINDINGS: Cardiomediastinal silhouette is unremarkable. Curvilinear calcification on the lateral radiograph over the superior aspect of the heart. The lungs are clear without pleural effusions or focal consolidations. Trachea projects midline and there is no pneumothorax. Soft tissue planes and included osseous structures are non-suspicious.  IMPRESSION: No acute cardiopulmonary process.  Mediastinal linear calcification may reflect Coronary artery calcification, pericardial calcification or even artifact.   Electronically Signed   By: Elon Alas   On: 04/27/2014 04:18     EKG Interpretation   Date/Time:  Thursday April 26 2014 23:57:39 EST Ventricular Rate:   87 PR Interval:  157 QRS Duration: 72 QT Interval:  371 QTC Calculation: 446 R Axis:   39 Text Interpretation:  Sinus rhythm Borderline T wave abnormalities No  significant change was found Confirmed by Florina Ou  MD, Jenny Reichmann ([id number]) on  04/27/2014 4:55:50 AM      MDM   Final diagnoses:  Shortness of breath    34 year old female since to the emergency department for a variety of a complaints which have been persistent over the past 5 days. Patient is well and nontoxic appearing, hemodynamically stable, and afebrile. Her physical exam is very reassuring. She has no focal neurologic deficits on exam. No headache today. Suspect that dizziness is secondary to vertigo. I also of a high suspicion that her symptoms, in totality, are secondary to a viral process. Chest x-ray shows no evidence of pneumonia. EKG unchanged from prior. Doubt PE and patient is low risk for this. No leukocytosis or left shift on laboratory work up to suggest infectious etiology.  Patient is pending urinalysis at shift change. Patient signed out to [facility], PA-C who will follow. Anticipate discharge with instruction for supportive treatment if UA negative. Camprubi-Soms, PA-C to reassess patient and disposition appropriately.   Filed Vitals:   04/27/14 0430 04/27/14 0500 04/27/14 0530 04/27/14 0600  BP: 134/85 128/74 117/67 134/81  Pulse: 90 86 89 95  Temp:      TempSrc:  Resp:      Height:      Weight:      SpO2: 95% 95% 97% 97%     Antonietta Breach, PA-C 04/27/14 0714  Wynetta Fines, MD 04/27/14 [phone]

## 2014-05-25 LAB — CULTURE, GROUP A STREP: Strep A Culture: NEGATIVE

## 2015-12-19 ENCOUNTER — Emergency Department (HOSPITAL_COMMUNITY): Payer: Medicaid Other

## 2015-12-19 ENCOUNTER — Observation Stay (HOSPITAL_COMMUNITY)
Admission: EM | Admit: 2015-12-19 | Discharge: 2015-12-21 | Disposition: A | Discharge status: Home / Self Care | Payer: Medicaid Other | Attending: Family Medicine | Admitting: Family Medicine

## 2015-12-19 ENCOUNTER — Observation Stay (HOSPITAL_COMMUNITY): Payer: Medicaid Other

## 2015-12-19 ENCOUNTER — Encounter (HOSPITAL_COMMUNITY): Payer: Self-pay | Admitting: Emergency Medicine

## 2015-12-19 DIAGNOSIS — I10 Essential (primary) hypertension: Secondary | ICD-10-CM | POA: Diagnosis not present

## 2015-12-19 DIAGNOSIS — R030 Elevated blood-pressure reading, without diagnosis of hypertension: Secondary | ICD-10-CM | POA: Diagnosis not present

## 2015-12-19 DIAGNOSIS — J329 Chronic sinusitis, unspecified: Secondary | ICD-10-CM | POA: Insufficient documentation

## 2015-12-19 DIAGNOSIS — Z888 Allergy status to other drugs, medicaments and biological substances status: Secondary | ICD-10-CM | POA: Insufficient documentation

## 2015-12-19 DIAGNOSIS — R531 Weakness: Secondary | ICD-10-CM | POA: Insufficient documentation

## 2015-12-19 DIAGNOSIS — R51 Headache: Secondary | ICD-10-CM | POA: Diagnosis not present

## 2015-12-19 DIAGNOSIS — R200 Anesthesia of skin: Secondary | ICD-10-CM | POA: Diagnosis not present

## 2015-12-19 DIAGNOSIS — G459 Transient cerebral ischemic attack, unspecified: Secondary | ICD-10-CM | POA: Diagnosis not present

## 2015-12-19 DIAGNOSIS — G43009 Migraine without aura, not intractable, without status migrainosus: Secondary | ICD-10-CM

## 2015-12-19 DIAGNOSIS — R4701 Aphasia: Secondary | ICD-10-CM | POA: Insufficient documentation

## 2015-12-19 DIAGNOSIS — R42 Dizziness and giddiness: Secondary | ICD-10-CM | POA: Diagnosis not present

## 2015-12-19 DIAGNOSIS — R202 Paresthesia of skin: Secondary | ICD-10-CM | POA: Diagnosis not present

## 2015-12-19 DIAGNOSIS — R55 Syncope and collapse: Secondary | ICD-10-CM | POA: Insufficient documentation

## 2015-12-19 DIAGNOSIS — R319 Hematuria, unspecified: Secondary | ICD-10-CM | POA: Insufficient documentation

## 2015-12-19 LAB — COMPREHENSIVE METABOLIC PANEL
ALT: 13 U/L — ABNORMAL LOW (ref 14–54)
AST: 18 U/L (ref 15–41)
Albumin: 3.9 g/dL (ref 3.5–5.0)
Alkaline Phosphatase: 61 U/L (ref 38–126)
Anion gap: 8 (ref 5–15)
BILIRUBIN TOTAL: 0.8 mg/dL (ref 0.3–1.2)
BUN: 10 mg/dL (ref 6–20)
CO2: 24 mmol/L (ref 22–32)
Calcium: 8.8 mg/dL — ABNORMAL LOW (ref 8.9–10.3)
Chloride: 106 mmol/L (ref 101–111)
Creatinine, Ser: 0.77 mg/dL (ref 0.44–1.00)
GFR calc Af Amer: >60 mL/min (ref >60)
Glucose, Bld: 91 mg/dL (ref 65–99)
POTASSIUM: 3.4 mmol/L — AB (ref 3.5–5.1)
Sodium: 138 mmol/L (ref 135–145)
TOTAL PROTEIN: 6.8 g/dL (ref 6.5–8.1)

## 2015-12-19 LAB — I-STAT CHEM 8, ED
BUN: 12 mg/dL (ref 6–20)
Calcium, Ion: 1.15 mmol/L (ref 1.15–1.40)
Chloride: 106 mmol/L (ref 101–111)
Creatinine, Ser: 0.7 mg/dL (ref 0.44–1.00)
GLUCOSE: 87 mg/dL (ref 65–99)
HEMATOCRIT: 39 % (ref 36.0–46.0)
HEMOGLOBIN: 13.3 g/dL (ref 12.0–15.0)
Potassium: 3.4 mmol/L — ABNORMAL LOW (ref 3.5–5.1)
Sodium: 143 mmol/L (ref 135–145)
TCO2: 25 mmol/L (ref 0–100)

## 2015-12-19 LAB — DIFFERENTIAL
Basophils Absolute: 0 10*3/uL (ref 0.0–0.1)
Basophils Relative: 0 %
EOS ABS: 0.4 10*3/uL (ref 0.0–0.7)
EOS PCT: 6 %
Lymphocytes Relative: 23 %
Lymphs Abs: 1.6 10*3/uL (ref 0.7–4.0)
MONOS PCT: 3 %
Monocytes Absolute: 0.2 10*3/uL (ref 0.1–1.0)
Neutro Abs: 5 10*3/uL (ref 1.7–7.7)
Neutrophils Relative %: 68 %

## 2015-12-19 LAB — PROTIME-INR
INR: 0.96
Prothrombin Time: 12.7 seconds (ref 11.4–15.2)

## 2015-12-19 LAB — URINALYSIS, ROUTINE W REFLEX MICROSCOPIC
Bilirubin Urine: NEGATIVE
Glucose, UA: NEGATIVE mg/dL
Ketones, ur: 15 mg/dL — AB
Nitrite: NEGATIVE
PH: 6 (ref 5.0–8.0)
PROTEIN: 30 mg/dL — AB
Specific Gravity, Urine: 1.013 (ref 1.005–1.030)

## 2015-12-19 LAB — URINE MICROSCOPIC-ADD ON

## 2015-12-19 LAB — CBC
HEMATOCRIT: 39.1 % (ref 36.0–46.0)
HEMOGLOBIN: 13 g/dL (ref 12.0–15.0)
MCH: 28.8 pg (ref 26.0–34.0)
MCHC: 33.2 g/dL (ref 30.0–36.0)
MCV: 86.7 fL (ref 78.0–100.0)
Platelets: 286 10*3/uL (ref 150–400)
RBC: 4.51 MIL/uL (ref 3.87–5.11)
RDW: 14.1 % (ref 11.5–15.5)
WBC: 7.3 10*3/uL (ref 4.0–10.5)

## 2015-12-19 LAB — RAPID URINE DRUG SCREEN, HOSP PERFORMED
Amphetamines: NOT DETECTED
BARBITURATES: NOT DETECTED
Benzodiazepines: NOT DETECTED
COCAINE: NOT DETECTED
OPIATES: NOT DETECTED
Tetrahydrocannabinol: NOT DETECTED

## 2015-12-19 LAB — I-STAT TROPONIN, ED: TROPONIN I, POC: 0 ng/mL (ref 0.00–0.08)

## 2015-12-19 LAB — ETHANOL: Alcohol, Ethyl (B): <5 mg/dL (ref <5)

## 2015-12-19 LAB — APTT: aPTT: 31 seconds (ref 24–36)

## 2015-12-19 LAB — TSH: TSH: 1.253 u[IU]/mL (ref 0.350–4.500)

## 2015-12-19 LAB — POC URINE PREG, ED: PREG TEST UR: NEGATIVE

## 2015-12-19 MED ORDER — ASPIRIN 325 MG PO TABS
325.0000 mg | ORAL_TABLET | Freq: Every day | ORAL | Status: DC
  Administered 2015-12-19 – 2015-12-20 (×2): 325 mg via ORAL
  Filled 2015-12-19 (×2): qty 1

## 2015-12-19 MED ORDER — SODIUM CHLORIDE 0.9 % IV SOLN: INTRAVENOUS | Status: DC

## 2015-12-19 MED ORDER — STROKE: EARLY STAGES OF RECOVERY BOOK
Freq: Once | Status: AC
  Administered 2015-12-19: 23:00:00

## 2015-12-19 NOTE — ED Triage Notes (Signed)
PT reports she had a severe headache with dizziness and nausea when she woke up this morning at 0600. Pain was located in temple. PT reports this is not a new occurrence and it is not the worst headache she has had. PT suspects she has hypertension. PT then slept throughout the day. When PT woke up to pick up her children she was still very dizzy, but pain had improved. PT reports at 1430 while she was driving her left arm became weak. At 1445 she noticed she had to use extra effort to articulate speech. Both the arm and speech complaint have resolved.

## 2015-12-19 NOTE — ED Provider Notes (Signed)
By signing my name below, I, Dolores Hoose, attest that this documentation has been prepared under the direction and in the presence of Ezequiel Essex, MD . Electronically Signed: Dolores Hoose, Scribe. 12/19/2015. 5:27 PM.   HPI Comments:  AGAM WRUBEL is a 35 y.o. female who presents to the Emergency Department complaining of  constant gradually resolving right sided headache beginning this morning. She has taken otc painkillers with mild relief for her pain. She has been having intermittent headaches and facial pain for several weeks. Pt reports that after her headaches began she began to notice some left-sided tingling, weakness, dizziness, double vision and some trouble talking.  Pt notes that her weakness, numbness and difficulty speaking has become slightly better over the course of the day.The symptoms lasted about 30 minutes and are now resolved. She denies any chest pain or shortness of breath.  Physical Exam:  Neuro:cranial nerves 2-12 intact. 5/5 strength throughout, no ataxia on finger to nose. No nystagmus. Visual fields full to confrontation. Cardio: Regular rate and rhythm.   nonfocal neuro exam at this time. CT head with sinus disease. TIA versus complex migraine which seems less likely given dysarthria.  D/w neuro.  I personally performed the services described in this documentation, which was scribed in my presence. The recorded information has been reviewed and is accurate.    Ezequiel Essex, MD 12/20/15 [phone]

## 2015-12-19 NOTE — ED Notes (Signed)
PT Remains in radiology at this time.

## 2015-12-19 NOTE — ED Notes (Signed)
EDP at bedside  

## 2015-12-19 NOTE — ED Provider Notes (Signed)
[facility] DEPT Provider Note   CSN: [id number] Arrival date & time: 12/19/15  1544     History   Chief Complaint Chief Complaint  Patient presents with  . Transient Ischemic Attack     HPI   Blood pressure 154/97, pulse 87, temperature 98.5 F (36.9 C), temperature source Oral, resp. rate 15, height 5\' 3"  (1.6 m), weight 97.5 kg, last menstrual period 12/18/2015, SpO2 98 %, unknown if currently breastfeeding.  Laurie Choi is a 35 y.o. female complaining of feeling dizzy described as like someone was shaking her body this a.m. when she woke up at 6, she also has a right-sided periorbital headache with associated nausea. She does have sinus congestion in this area, states that she felt like her blood pressure was elevated, she has no formal diagnosis of hypertension however she doesn't have a primary care physician she states that she felt run down and slept throughout the day which is atypical for her, she woke up to take her kids up at school and she states that at about 245 she was still extremely dizzy and her left arm became both tingling and weak, she went in to try to look for her kids and she stated that she had some expressive aphasia but the people at the school could understand her. Symptoms lasted for approximately 15 minutes they resolved by the time the EMS came to pick her up. Mother died under the age of 10 from lupus, patient states she's been tested for lupus and is negative. She did not have any chest pains, palpitations, ataxia, cervicalgia. She did in her normal state of health, eating and drinking normally with normal bowel bladder habits. Does not take a daily aspirin, does not smoke cigarettes.  Past Medical History:  Diagnosis Date  . Hyperemesis gravidarum before end of [redacted] week gestation, dehydration   . No pertinent past medical history     Patient Active Problem List   Diagnosis Date Noted  . TIA (transient ischemic attack) 12/19/2015  .  Elevated blood pressure reading   . SVD (spontaneous vaginal delivery) 10/19/2010    History reviewed. No pertinent surgical history.  OB History    Gravida Para Term Preterm AB Living   2 2 1     2    SAB TAB Ectopic Multiple Live Births           1       Home Medications    Prior to Admission medications   Medication Sig Start Date End Date Taking? Authorizing Provider  ibuprofen (ADVIL,MOTRIN) 200 MG tablet Take 600 mg by mouth every 6 (six) hours as needed.   Yes Historical Provider, MD  meclizine (ANTIVERT) 50 MG tablet Take 1 tablet (50 mg total) by mouth 3 (three) times daily as needed. Patient not taking: Reported on 12/19/2015 04/27/14   Antonietta Breach, PA-C  naproxen (NAPROSYN) 500 MG tablet Take 1 tablet (500 mg total) by mouth 2 (two) times daily. Patient not taking: Reported on 12/19/2015 04/27/14   Antonietta Breach, PA-C    Family History Family History  Problem Relation Age of Onset  . Lupus Mother   . Hypertension Father   . Diabetes Father     Social History Social History  Substance Use Topics  . Smoking status: Never Smoker  . Smokeless tobacco: Never Used  . Alcohol use No     Allergies   Tape   Review of Systems Review of Systems  10 systems reviewed and found to  be negative, except as noted in the HPI.   Physical Exam Updated Vital Signs BP (!) 154/86 (BP Location: Left Arm)   Pulse 96   Temp 98.2 F (36.8 C) (Oral)   Resp 17   Ht 5\' 2"  (1.575 m)   Wt 99.3 kg   LMP 12/18/2015   SpO2 98%   BMI 40.04 kg/m   Physical Exam  Constitutional: She is oriented to person, place, and time. She appears well-developed and well-nourished. No distress.  HENT:  Head: Normocephalic and atraumatic.  Mouth/Throat: Oropharynx is clear and moist.  Eyes: Conjunctivae and EOM are normal. Pupils are equal, round, and reactive to light.  No TTP of maxillary or frontal sinuses  No TTP or induration of temporal arteries bilaterally  Neck: Normal range of  motion. Neck supple.  FROM to C-spine. Pt can touch chin to chest without discomfort. No TTP of midline cervical spine.   Cardiovascular: Normal rate, regular rhythm and intact distal pulses.   Pulmonary/Chest: Effort normal and breath sounds normal. No respiratory distress. She has no wheezes. She has no rales. She exhibits no tenderness.  Abdominal: Soft. Bowel sounds are normal. There is no tenderness.  Musculoskeletal: Normal range of motion. She exhibits no edema or tenderness.  Neurological: She is alert and oriented to person, place, and time. No cranial nerve deficit.  II-Visual fields grossly intact. III/IV/VI-Extraocular movements intact.  Pupils reactive bilaterally. V/VII-Smile symmetric, equal eyebrow raise,  facial sensation intact VIII- Hearing grossly intact IX/X-Normal gag XI-bilateral shoulder shrug XII-midline tongue extension Motor: 5/5 bilaterally with normal tone and bulk Cerebellar: Normal finger-to-nose  and normal heel-to-shin test.   Romberg negative Ambulates with a coordinated gait   Skin: She is not diaphoretic.  Psychiatric: She has a normal mood and affect.  Nursing note and vitals reviewed.    ED Treatments / Results  Labs (all labs ordered are listed, but only abnormal results are displayed) Labs Reviewed  COMPREHENSIVE METABOLIC PANEL - Abnormal; Notable for the following:       Result Value   Potassium 3.4 (*)    Calcium 8.8 (*)    ALT 13 (*)    All other components within normal limits  URINALYSIS, ROUTINE W REFLEX MICROSCOPIC (NOT AT [facility]) - Abnormal; Notable for the following:    Color, Urine RED (*)    APPearance CLOUDY (*)    Hgb urine dipstick LARGE (*)    Ketones, ur 15 (*)    Protein, ur 30 (*)    Leukocytes, UA SMALL (*)    All other components within normal limits  URINE MICROSCOPIC-ADD ON - Abnormal; Notable for the following:    Squamous Epithelial / LPF 6-30 (*)    Bacteria, UA FEW (*)    All other components within normal  limits  I-STAT CHEM 8, ED - Abnormal; Notable for the following:    Potassium 3.4 (*)    All other components within normal limits  ETHANOL  PROTIME-INR  APTT  CBC  DIFFERENTIAL  RAPID URINE DRUG SCREEN, HOSP PERFORMED  TSH  ANTITHROMBIN III  HEMOGLOBIN A1C  LIPID PANEL  PROTEIN C ACTIVITY  PROTEIN C, TOTAL  PROTEIN S ACTIVITY  PROTEIN S, TOTAL  LUPUS ANTICOAGULANT PANEL  BETA-2-GLYCOPROTEIN I ABS, IGG/M/A  HOMOCYSTEINE  FACTOR 5 LEIDEN  PROTHROMBIN GENE MUTATION  CARDIOLIPIN ANTIBODIES, IGG, IGM, IGA  I-STAT TROPOININ, ED  POC URINE PREG, ED    EKG  EKG Interpretation  Date/Time:  Thursday December 19 2015 16:20:35 EDT Ventricular  Rate:  86 PR Interval:    QRS Duration: 84 QT Interval:  376 QTC Calculation: 450 R Axis:   40 Text Interpretation:  Sinus rhythm No significant change was found Confirmed by Wyvonnia Dusky  MD, STEPHEN [phone]) on 12/19/2015 4:38:13 PM       Radiology Dg Chest 2 View  Result Date: 12/19/2015 CLINICAL DATA:  Hypertension. EXAM: CHEST  2 VIEW COMPARISON:  Radiograph of April 27, 2014. FINDINGS: The heart size and mediastinal contours are within normal limits. Both lungs are clear. No pneumothorax or pleural effusion is noted. The visualized skeletal structures are unremarkable. IMPRESSION: No active cardiopulmonary disease. Electronically Signed   By: Marijo Conception, M.D.   On: 12/19/2015 16:41   Ct Head Wo Contrast  Result Date: 12/19/2015 CLINICAL DATA:  Headache with dizziness.  Left arm numbness. EXAM: CT HEAD WITHOUT CONTRAST TECHNIQUE: Contiguous axial images were obtained from the base of the skull through the vertex without intravenous contrast. COMPARISON:  None. FINDINGS: Brain: The ventricles are normal in size and configuration. The left lateral ventricle is marginally larger than the right lateral ventricle, an anatomic variant. There is no intracranial mass, hemorrhage, extra-axial fluid collection, or midline shift. Gray-white  compartments appear normal. No acute infarct evident. Vascular: No hyperdense vessel.  No vascular calcification evident. Skull: Bony calvarium appears intact. Sinuses/Orbits: There is opacification with air-fluid level in the right maxillary antrum. There is extensive ethmoid sinus disease bilaterally. Frontal sinuses are essentially aplastic. There is mild mucosal thickening in the posterior left sphenoid region. Visualized orbits appear symmetric bilaterally. Other: Visualized mastoid air cells are clear. IMPRESSION: Extensive paranasal sinus disease, most marked in the ethmoid air cell complexes bilaterally. No intracranial mass, hemorrhage, or extra-axial fluid collection. Gray-white compartments appear unremarkable. No acute infarct evident. Electronically Signed   By: Lowella Grip III M.D.   On: 12/19/2015 16:58    Procedures Procedures (including critical care time)  Medications Ordered in ED Medications  0.9 %  sodium chloride infusion (not administered)  aspirin tablet 325 mg (325 mg Oral Given 12/19/15 2236)   stroke: mapping our early stages of recovery book ( Does not apply Given 12/19/15 2236)     Initial Impression / Assessment and Plan / ED Course  I have reviewed the triage vital signs and the nursing notes.  Pertinent labs & imaging results that were available during my care of the patient were reviewed by me and considered in my medical decision making (see chart for details).  Clinical Course    Vitals:   12/19/15 1844 12/19/15 1939 12/19/15 2241 12/20/15 0041  BP:  (!) 159/99 (!) 170/113 (!) 154/86  Pulse:  85 82 96  Resp:  16 17 17   Temp: 98.7 F (37.1 C) 99 F (37.2 C) 98.2 F (36.8 C)   TempSrc:  Oral Oral   SpO2:  98% 99% 98%  Weight:  99.3 kg    Height:  5\' 2"  (1.575 m)      Medications  0.9 %  sodium chloride infusion (not administered)  aspirin tablet 325 mg (325 mg Oral Given 12/19/15 2236)   stroke: mapping our early stages of recovery book (  Does not apply Given 12/19/15 2236)    Laurie Choi is 35 y.o. female presenting with Sensation of dizziness and headache, she had fatigue all day and at 245 she developed a left arm numbness and associated weakness with some expressive aphasia which resolved within 15 minutes. Nonfocal neurologic exam, CVA workup initiated,  will need admission for TIA.  Workup unremarkable except for a sinusitis, don't think this is causing her symptoms. I don't think this is accompanied migraine. Blood pressure elevated, no outpatient care, would like admission for TIA workup, unassigned admission to family medicine discussed with resident Dr. Andy Gauss who accepts admission on behalf of attending Dr. Ardelia Mems   Final Clinical Impressions(s) / ED Diagnoses   Final diagnoses:  Transient cerebral ischemia, unspecified type  Elevated blood pressure reading    New Prescriptions Current Discharge Medication List       Monico Blitz, PA-C 12/20/15 0045    Ezequiel Essex, MD 12/20/15 [phone]

## 2015-12-19 NOTE — H&P (Signed)
[facility] Admission History and Physical Service Pager: [phone]  Patient name: Laurie Choi Medical record number: [id number] Date of birth: 17-Sep-1980 Age: 35 y.o. Gender: female  Primary Care Provider: No PCP Per Patient Consultants: Neurology Code Status: Full (obtained on admission)  Chief Complaint: Headaches, dizziness and left arm numbness and tingling  Assessment and Plan: Laurie Choi is a 35 y.o. female with no past medical history presenting with headaches, dizziness, difficulty speaking, left arm numbness and tingling which had resolved upon admission and consistent with TIA. She has no known PMH. She does not have PCP.  #TIA, acute: Resolved.  Patient presented with a two week history of headaches. This morning, headache was 10/10 sharp and mostly frontal accompanied with some dizziness. In addition, patient experienced left arm numbness and tingling and expressive aphasia with both symptoms quickly resolving in the matter of 15-30 minutes. On exam, patient had right carotid bruits and mild systolic murmur (SEM over RUSB).  Based on clinical presentation and accompanying symptoms, TIA would be at the top of our differential list. Patient might also be experiencing migraines headaches although unlikely, with no prior history, no aura, no unilaterality, no throbbing pain. Patient could also be experiencing side effect from elevated BP. Patient is uninsured and has not seen a PCP in years. She could be having hypertensive crisis manifesting as headaches. Patient is not having a subarachnoid hemorrhage as CT did not show any bleed. Patient with a history headaches secondary sinus pressure and extensive paranasal disease which could explained the headaches but would not have expressive aphasia and left arm numbness. Less concern for infectious process like meningitis with patient having normal vitals, afebrile, no WBCs count and unremarkable neuro  exam and no neck stiffness. Leading differential  diagnosis is TIA vs hypertensive crisis vs migraines headaches vs sinus complications vs menngitis --Admit to [facility], admitting physician Dr. Ardelia Mems --Consult Neurology (consulted) --ASA 325 mg  --Carotids Doppler  --MRI/MRA --Lipid panel --TSH --A1c --Follow up on MRA --Monitor vitals q2 for the next 12hrs and then 4hrs --SLP/PT/OT --Cardiac monitor --Permissive hypertension --Hypercoagulable lab ordered per neuro recommendation  Hypertension: Patient was normotensive about 2 years ago when she came to ED for dyspnea. Unclear if the elevated blood pressure is underlying hypertension or response to CVA -Permissive hypertension for now -If carotid Doppler, MRI or MRA are benign, recommend tight control of blood pressure after 24hr  Sinusitis: CT head with extensive para nasal disease. Sent also with headache and "sinus congestion" for about 2 weeks. -Augmentin 875 mg twice a day   FEN/GI:  NS  Regular Diet. Patient passed bedside RN swallow test  Prophylaxis:  SCDs  Disposition: Admit to stepdown for evaluation and management of TIA. Patient without PCP. Care management was consulted.   History of Present Illness:  Laurie Choi is a 35 y.o. female with no significant past medical history who presented today with headaches, dizziness, left arm numbness, tingling and difficulty speaking. Patient was in her normal state of health yesterday night when she went to bed. Patient reports that this morning when she woke up, she had a severe headache 10/10 mostly localized to her forehead. She described the pain as sharp. Patient also report dizziness, and felt like she was being "shaken" by somebody and did not feel like the room was spinning or the floor was moving under her. Patient took 3 ibuprofen for her headaches and manage to take her kids to school. Upon her return,  headache was still present and she felt dizzy. Patient was able  to have breakfast, and then until around 2:15 when she woke up to go pick up her sons in schools. While she was driving to school around 2:30 pm she started to notice left arm numbness and tingling starting in her hand and radiating to the rest of her arm. Upon arrival to the school, she was able to climb a small hill with difficulty and when she arrived at the office patient was short of breath and found it very hard to speak. Her speech was not slurred, but it was difficult to "get my words out". EMS was called at the scene and patient was transported to the ED. Upon arrival to the ED, patient was stable, she still had a moderate headache mostly frontal with radiation to the right periorbital area and some dizziness but left arm numbness tingling and difficulty with speech had resolved. Head CT, UA, EKG and  were done. Of note patient has been complaining of mild headache for the past two weeks according to her husband, and has had severe facial pressure from congested sinus. On 10/02, patient reports that she almost passed out at the gym, was on her knee with a BP measured at the time at 165/100. Patient does not have a PCP.    Review Of Systems: Per HPI with the following additions:  Review of Systems  Constitutional: Negative for chills and fever.  HENT: Positive for congestion.   Eyes: Negative for blurred vision, photophobia and pain.  Respiratory: Positive for cough. Negative for sputum production and shortness of breath.   Cardiovascular: Negative for chest pain and palpitations.  Gastrointestinal: Negative for nausea and vomiting.  Neurological: Positive for dizziness, tingling and headaches. Negative for focal weakness.  Psychiatric/Behavioral: Negative for depression.    Patient Active Problem List   Diagnosis Date Noted  . TIA (transient ischemic attack) 12/19/2015  . Elevated blood pressure reading   . SVD (spontaneous vaginal delivery) 10/19/2010    Past Medical History: Past  Medical History:  Diagnosis Date  . Hyperemesis gravidarum before end of [redacted] week gestation, dehydration   . No pertinent past medical history     Past Surgical History: History reviewed. No pertinent surgical history.  Social History: Social History  Substance Use Topics  . Smoking status: Never Smoker  . Smokeless tobacco: Never Used  . Alcohol use No   Additional social history:  Please also refer to relevant sections of EMR.  Family History: Family History  Problem Relation Age of Onset  . Lupus Mother   . Hypertension Father   . Diabetes Father    (If not completed, MUST add something in)  Allergies and Medications: Allergies  Allergen Reactions  . Tape Other (See Comments)    Medical redness, rash itching    No current facility-administered medications on file prior to encounter.    Current Outpatient Prescriptions on File Prior to Encounter  Medication Sig Dispense Refill  . meclizine (ANTIVERT) 50 MG tablet Take 1 tablet (50 mg total) by mouth 3 (three) times daily as needed. (Patient not taking: Reported on 12/19/2015) 30 tablet 0  . naproxen (NAPROSYN) 500 MG tablet Take 1 tablet (500 mg total) by mouth 2 (two) times daily. (Patient not taking: Reported on 12/19/2015) 30 tablet 0    Objective: BP (!) 159/99 (BP Location: Left Arm)   Pulse 85   Temp 99 F (37.2 C) (Oral)   Resp 16   Ht  5\' 2"  (1.575 m)   Wt 218 lb 14.7 oz (99.3 kg)   LMP 12/18/2015   SpO2 98%   BMI 40.04 kg/m  Exam: Physical Exam: General Appearance:  Pleasant woman, in no acute distress and cooperative able to answer question and participate in exam. Head/face:  NCAT Eyes:  PERRL and EOMI Nose/Sinuses:  Nares normal. Septum midline. Mucosa normal. No drainage with some sinus tenderness/pressure. Mouth/Throat:  Mucosa moist, no lesions; pharynx without erythema, edema or exudate. Neck:  Supple, no mass, non-tender, + right carotid bruits, no jvd, Adenopathy- absent Lungs:  Normal  expansion.  Clear to auscultation.  No rales, rhonchi, or wheezing. Heart:   Normal S1, S2.  Regular rate and rhythm mild systolic murmur over RUSB Abdomen:  Soft, non-tender, normal bowel sounds; no bruits, organomegaly or masses. Skin:  Skin color, texture, turgor are normal; there are no bruises, rashes or lesions. Extremities: Extremities warm to touch, pink, with no edema. and pulses present in all extremities  Musculoskeletal:  Spine range of motion normal. Muscular strength intact 5/5. Neurologic:  Alert and oriented x 3, CN II-12 intact, motor 5/5 in all extremities, gait normal, reflexes normal and symmetric, strength and  sensation grossly normal, no dysarthria, no pronator drift Psych exam: Normal mood and affect, normal behavior and no psychotic features  Labs and Imaging: CBC BMET   Recent Labs Lab 12/19/15 1659 12/19/15 1711  WBC 7.3  --   HGB 13.0 13.3  HCT 39.1 39.0  PLT 286  --     Recent Labs Lab 12/19/15 1659 12/19/15 1711  NA 138 143  K 3.4* 3.4*  CL 106 106  CO2 24  --   BUN 10 12  CREATININE 0.77 0.70  GLUCOSE 91 87  CALCIUM 8.8*  --     Dg Chest 2 View  Result Date: 12/19/2015 CLINICAL DATA:  Hypertension. EXAM: CHEST  2 VIEW COMPARISON:  Radiograph of April 27, 2014. FINDINGS: The heart size and mediastinal contours are within normal limits. Both lungs are clear. No pneumothorax or pleural effusion is noted. The visualized skeletal structures are unremarkable. IMPRESSION: No active cardiopulmonary disease. Electronically Signed   By: Marijo Conception, M.D.   On: 12/19/2015 16:41   Ct Head Wo Contrast  Result Date: 12/19/2015 CLINICAL DATA:  Headache with dizziness.  Left arm numbness. EXAM: CT HEAD WITHOUT CONTRAST TECHNIQUE: Contiguous axial images were obtained from the base of the skull through the vertex without intravenous contrast. COMPARISON:  None. FINDINGS: Brain: The ventricles are normal in size and configuration. The left lateral  ventricle is marginally larger than the right lateral ventricle, an anatomic variant. There is no intracranial mass, hemorrhage, extra-axial fluid collection, or midline shift. Gray-white compartments appear normal. No acute infarct evident. Vascular: No hyperdense vessel.  No vascular calcification evident. Skull: Bony calvarium appears intact. Sinuses/Orbits: There is opacification with air-fluid level in the right maxillary antrum. There is extensive ethmoid sinus disease bilaterally. Frontal sinuses are essentially aplastic. There is mild mucosal thickening in the posterior left sphenoid region. Visualized orbits appear symmetric bilaterally. Other: Visualized mastoid air cells are clear. IMPRESSION: Extensive paranasal sinus disease, most marked in the ethmoid air cell complexes bilaterally. No intracranial mass, hemorrhage, or extra-axial fluid collection. Gray-white compartments appear unremarkable. No acute infarct evident. Electronically Signed   By: Lowella Grip III M.D.   On: 12/19/2015 16:58   Marjie Skiff, MD 12/19/2015, 10:31 PM PGY-1, [place] [facility] pager: [phone], text pages welcome  I have seen and evaluated the patient with Dr. Andy Gauss. I am in agreement with the note above in its revised form. My additions are in red.  Wendee Beavers, MD, PGY-2 12/20/2015 4:56 AM

## 2015-12-19 NOTE — ED Notes (Signed)
Nurse drawing labs. 

## 2015-12-19 NOTE — ED Notes (Signed)
EKG given to Dr. Rancour 

## 2015-12-19 NOTE — Consult Note (Signed)
Admission H&P    Chief Complaint: Transient numbness and speech difficulty.  HPI: Laurie Choi is an 35 y.o. female with no documented medical disorder who has been experiencing headaches and dizziness over the past several weeks intermittently. She woke up with headache this morning and at about 2:30 this afternoon she developed numbness involving her left side as well as speech output difficulty. Deficits subsequently resolved. There was no mention of focal weakness. No facial droop was reported. She has no history of stroke nor TIA. She has not been on antiplatelet therapy. CT scan of her head showed no acute intracranial abnormality. Extensive paranasal sinus disease was noted, most marked in the ethmoid air cells. NIH stroke score at the time of this evaluation was 0.  LSN: 2:30 PM on 12/19/2015 tPA Given: No: Deficits resolved mRankin:  Past Medical History:  Diagnosis Date  . Hyperemesis gravidarum before end of [redacted] week gestation, dehydration   . No pertinent past medical history     History reviewed. No pertinent surgical history.  No family history on file. Social History:  reports that she has never smoked. She has never used smokeless tobacco. She reports that she does not drink alcohol or use drugs.  Allergies:  Allergies  Allergen Reactions  . Tape Other (See Comments)    Medical redness, rash itching     Medications Prior to Admission  Medication Sig Dispense Refill  . ibuprofen (ADVIL,MOTRIN) 200 MG tablet Take 600 mg by mouth every 6 (six) hours as needed.    . meclizine (ANTIVERT) 50 MG tablet Take 1 tablet (50 mg total) by mouth 3 (three) times daily as needed. (Patient not taking: Reported on 12/19/2015) 30 tablet 0  . naproxen (NAPROSYN) 500 MG tablet Take 1 tablet (500 mg total) by mouth 2 (two) times daily. (Patient not taking: Reported on 12/19/2015) 30 tablet 0    ROS: History obtained from the patient  General ROS: negative for - chills, fatigue,  fever, night sweats, weight gain or weight loss Psychological ROS: negative for - behavioral disorder, hallucinations, memory difficulties, mood swings or suicidal ideation Ophthalmic ROS: negative for - blurry vision, double vision, eye pain or loss of vision ENT ROS: negative for - epistaxis, nasal discharge, oral lesions, sore throat, tinnitus or vertigo Allergy and Immunology ROS: negative for - hives or itchy/watery eyes Hematological and Lymphatic ROS: negative for - bleeding problems, bruising or swollen lymph nodes Endocrine ROS: negative for - galactorrhea, hair pattern changes, polydipsia/polyuria or temperature intolerance Respiratory ROS: negative for - cough, hemoptysis, shortness of breath or wheezing Cardiovascular ROS: negative for - chest pain, dyspnea on exertion, edema or irregular heartbeat Gastrointestinal ROS: negative for - abdominal pain, diarrhea, hematemesis, nausea/vomiting or stool incontinence Genito-Urinary ROS: negative for - dysuria, hematuria, incontinence or urinary frequency/urgency Musculoskeletal ROS: negative for - joint swelling or muscular weakness Neurological ROS: as noted in HPI, recent near syncopal episode, presumed due to hypoglycemia Dermatological ROS: negative for rash and skin lesion changes  Physical Examination: Blood pressure (!) 159/99, pulse 85, temperature 99 F (37.2 C), temperature source Oral, resp. rate 16, height '5\' 2"'  (1.575 m), weight 99.3 kg (218 lb 14.7 oz), last menstrual period 12/18/2015, SpO2 98 %, unknown if currently breastfeeding.  HEENT-  Normocephalic, no lesions, without obvious abnormality.  Normal external eye and conjunctiva.  Normal TM's bilaterally.  Normal auditory canals and external ears. Normal external nose, mucus membranes and septum.  Normal pharynx. Neck supple with no masses, nodes, nodules or  enlargement. Cardiovascular - regular rate and rhythm, S1, S2 normal, no murmur, click, rub or gallop Lungs - chest  clear, no wheezing, rales, normal symmetric air entry Abdomen - soft, non-tender; bowel sounds normal; no masses,  no organomegaly Extremities - no joint deformities, effusion, or inflammation and no edema  Neurologic Examination: Mental Status: Alert, oriented, thought content appropriate.  Speech fluent without evidence of aphasia. Able to follow commands without difficulty. Cranial Nerves: II-Visual fields were normal. III/IV/VI-Pupils were equal and reacted normally to light. Extraocular movements were full and conjugate.    V/VII-no facial numbness and no facial weakness. VIII-normal. X-normal speech and symmetrical palatal movement. XI: trapezius strength/neck flexion strength normal bilaterally XII-midline tongue extension with normal strength. Motor: 5/5 bilaterally with normal tone and bulk Sensory: Normal throughout. Deep Tendon Reflexes: 1+ and symmetric. Plantars: Flexor bilaterally Cerebellar: Normal finger-to-nose testing. Carotid auscultation: Normal  Results for orders placed or performed during the hospital encounter of 12/19/15 (from the past 48 hour(s))  Ethanol     Status: None   Collection Time: 12/19/15  4:59 PM  Result Value Ref Range   Alcohol, Ethyl (B) <5 <5 mg/dL    Comment:        LOWEST DETECTABLE LIMIT FOR SERUM ALCOHOL IS 5 mg/dL FOR MEDICAL PURPOSES ONLY   Protime-INR     Status: None   Collection Time: 12/19/15  4:59 PM  Result Value Ref Range   Prothrombin Time 12.7 11.4 - 15.2 seconds   INR 0.96   APTT     Status: None   Collection Time: 12/19/15  4:59 PM  Result Value Ref Range   aPTT 31 24 - 36 seconds  CBC     Status: None   Collection Time: 12/19/15  4:59 PM  Result Value Ref Range   WBC 7.3 4.0 - 10.5 K/uL   RBC 4.51 3.87 - 5.11 MIL/uL   Hemoglobin 13.0 12.0 - 15.0 g/dL   HCT 39.1 36.0 - 46.0 %   MCV 86.7 78.0 - 100.0 fL   MCH 28.8 26.0 - 34.0 pg   MCHC 33.2 30.0 - 36.0 g/dL   RDW 14.1 11.5 - 15.5 %   Platelets 286 150 - 400  K/uL  Differential     Status: None   Collection Time: 12/19/15  4:59 PM  Result Value Ref Range   Neutrophils Relative % 68 %   Neutro Abs 5.0 1.7 - 7.7 K/uL   Lymphocytes Relative 23 %   Lymphs Abs 1.6 0.7 - 4.0 K/uL   Monocytes Relative 3 %   Monocytes Absolute 0.2 0.1 - 1.0 K/uL   Eosinophils Relative 6 %   Eosinophils Absolute 0.4 0.0 - 0.7 K/uL   Basophils Relative 0 %   Basophils Absolute 0.0 0.0 - 0.1 K/uL  Comprehensive metabolic panel     Status: Abnormal   Collection Time: 12/19/15  4:59 PM  Result Value Ref Range   Sodium 138 135 - 145 mmol/L   Potassium 3.4 (L) 3.5 - 5.1 mmol/L   Chloride 106 101 - 111 mmol/L   CO2 24 22 - 32 mmol/L   Glucose, Bld 91 65 - 99 mg/dL   BUN 10 6 - 20 mg/dL   Creatinine, Ser 0.77 0.44 - 1.00 mg/dL   Calcium 8.8 (L) 8.9 - 10.3 mg/dL   Total Protein 6.8 6.5 - 8.1 g/dL   Albumin 3.9 3.5 - 5.0 g/dL   AST 18 15 - 41 U/L   ALT 13 (L) 14 -  54 U/L   Alkaline Phosphatase 61 38 - 126 U/L   Total Bilirubin 0.8 0.3 - 1.2 mg/dL   GFR calc non Af Amer >60 >60 mL/min   GFR calc Af Amer >60 >60 mL/min    Comment: (NOTE) The eGFR has been calculated using the CKD EPI equation. This calculation has not been validated in all clinical situations. eGFR's persistently <60 mL/min signify possible Chronic Kidney Disease.    Anion gap 8 5 - 15  I-stat troponin, ED (not at [facility], [facility])     Status: None   Collection Time: 12/19/15  5:09 PM  Result Value Ref Range   Troponin i, poc 0.00 0.00 - 0.08 ng/mL   Comment 3            Comment: Due to the release kinetics of cTnI, a negative result within the first hours of the onset of symptoms does not rule out myocardial infarction with certainty. If myocardial infarction is still suspected, repeat the test at appropriate intervals.   I-Stat Chem 8, ED  (not at [facility], [facility])     Status: Abnormal   Collection Time: 12/19/15  5:11 PM  Result Value Ref Range   Sodium 143 135 - 145 mmol/L   Potassium 3.4 (L) 3.5  - 5.1 mmol/L   Chloride 106 101 - 111 mmol/L   BUN 12 6 - 20 mg/dL   Creatinine, Ser 0.70 0.44 - 1.00 mg/dL   Glucose, Bld 87 65 - 99 mg/dL   Calcium, Ion 1.15 1.15 - 1.40 mmol/L   TCO2 25 0 - 100 mmol/L   Hemoglobin 13.3 12.0 - 15.0 g/dL   HCT 39.0 36.0 - 46.0 %  Urinalysis, Routine w reflex microscopic (not at [facility])     Status: Abnormal   Collection Time: 12/19/15  5:15 PM  Result Value Ref Range   Color, Urine RED (A) YELLOW    Comment: BIOCHEMICALS MAY BE AFFECTED BY COLOR   APPearance CLOUDY (A) CLEAR   Specific Gravity, Urine 1.013 1.005 - 1.030   pH 6.0 5.0 - 8.0   Glucose, UA NEGATIVE NEGATIVE mg/dL   Hgb urine dipstick LARGE (A) NEGATIVE   Bilirubin Urine NEGATIVE NEGATIVE   Ketones, ur 15 (A) NEGATIVE mg/dL   Protein, ur 30 (A) NEGATIVE mg/dL   Nitrite NEGATIVE NEGATIVE   Leukocytes, UA SMALL (A) NEGATIVE  Urine microscopic-add on     Status: Abnormal   Collection Time: 12/19/15  5:15 PM  Result Value Ref Range   Squamous Epithelial / LPF 6-30 (A) NONE SEEN   WBC, UA 0-5 0 - 5 WBC/hpf   RBC / HPF TOO NUMEROUS TO COUNT 0 - 5 RBC/hpf   Bacteria, UA FEW (A) NONE SEEN  POC Urine Pregnancy, ED  (not at [facility])     Status: None   Collection Time: 12/19/15  5:27 PM  Result Value Ref Range   Preg Test, Ur NEGATIVE NEGATIVE    Comment:        THE SENSITIVITY OF THIS METHODOLOGY IS >24 mIU/mL   Urine rapid drug screen (hosp performed)not at [facility]     Status: None   Collection Time: 12/19/15  5:54 PM  Result Value Ref Range   Opiates NONE DETECTED NONE DETECTED   Cocaine NONE DETECTED NONE DETECTED   Benzodiazepines NONE DETECTED NONE DETECTED   Amphetamines NONE DETECTED NONE DETECTED   Tetrahydrocannabinol NONE DETECTED NONE DETECTED   Barbiturates NONE DETECTED NONE DETECTED    Comment:  DRUG SCREEN FOR MEDICAL PURPOSES ONLY.  IF CONFIRMATION IS NEEDED FOR ANY PURPOSE, NOTIFY LAB WITHIN 5 DAYS.        LOWEST DETECTABLE LIMITS FOR URINE DRUG SCREEN Drug  Class       Cutoff (ng/mL) Amphetamine      1000 Barbiturate      200 Benzodiazepine   867 Tricyclics       619 Opiates          300 Cocaine          300 THC              50    Dg Chest 2 View  Result Date: 12/19/2015 CLINICAL DATA:  Hypertension. EXAM: CHEST  2 VIEW COMPARISON:  Radiograph of April 27, 2014. FINDINGS: The heart size and mediastinal contours are within normal limits. Both lungs are clear. No pneumothorax or pleural effusion is noted. The visualized skeletal structures are unremarkable. IMPRESSION: No active cardiopulmonary disease. Electronically Signed   By: Marijo Conception, M.D.   On: 12/19/2015 16:41   Ct Head Wo Contrast  Result Date: 12/19/2015 CLINICAL DATA:  Headache with dizziness.  Left arm numbness. EXAM: CT HEAD WITHOUT CONTRAST TECHNIQUE: Contiguous axial images were obtained from the base of the skull through the vertex without intravenous contrast. COMPARISON:  None. FINDINGS: Brain: The ventricles are normal in size and configuration. The left lateral ventricle is marginally larger than the right lateral ventricle, an anatomic variant. There is no intracranial mass, hemorrhage, extra-axial fluid collection, or midline shift. Gray-white compartments appear normal. No acute infarct evident. Vascular: No hyperdense vessel.  No vascular calcification evident. Skull: Bony calvarium appears intact. Sinuses/Orbits: There is opacification with air-fluid level in the right maxillary antrum. There is extensive ethmoid sinus disease bilaterally. Frontal sinuses are essentially aplastic. There is mild mucosal thickening in the posterior left sphenoid region. Visualized orbits appear symmetric bilaterally. Other: Visualized mastoid air cells are clear. IMPRESSION: Extensive paranasal sinus disease, most marked in the ethmoid air cell complexes bilaterally. No intracranial mass, hemorrhage, or extra-axial fluid collection. Gray-white compartments appear unremarkable. No acute  infarct evident. Electronically Signed   By: Lowella Grip III M.D.   On: 12/19/2015 16:58    Assessment: 35 y.o. female with previously undocumented hypertension presenting with possible right subcortical small vessel TIA. Migraine equivalent is a consideration, as well, but less likely.  Stroke Risk Factors - hypertension  Plan: 1. HgbA1c, fasting lipid panel 2. MRI, MRA  of the brain without contrast 3. Hypercoagulopathy panel 4. Echocardiogram 5. Carotid dopplers 6. Prophylactic therapy-Antiplatelet med: Aspirin  7. Risk factor modification 8. Telemetry monitoring  C.R. Nicole Kindred, MD [facility] [phone]  12/19/2015, 8:01 PM

## 2015-12-20 ENCOUNTER — Observation Stay (HOSPITAL_BASED_OUTPATIENT_CLINIC_OR_DEPARTMENT_OTHER): Payer: Medicaid Other

## 2015-12-20 DIAGNOSIS — G458 Other transient cerebral ischemic attacks and related syndromes: Secondary | ICD-10-CM

## 2015-12-20 DIAGNOSIS — G43009 Migraine without aura, not intractable, without status migrainosus: Secondary | ICD-10-CM

## 2015-12-20 LAB — VITAMIN B12: VITAMIN B 12: 507 pg/mL (ref 180–914)

## 2015-12-20 LAB — HEMOGLOBIN A1C
Hgb A1c MFr Bld: 5.8 % — ABNORMAL HIGH (ref 4.8–5.6)
MEAN PLASMA GLUCOSE: 120 mg/dL

## 2015-12-20 LAB — CREATININE, SERUM
CREATININE: 0.69 mg/dL (ref 0.44–1.00)
GFR calc Af Amer: >60 mL/min (ref >60)

## 2015-12-20 LAB — LIPID PANEL
CHOL/HDL RATIO: 3.9 ratio
Cholesterol: 200 mg/dL (ref 0–200)
HDL: 51 mg/dL (ref >40)
LDL Cholesterol: 137 mg/dL — ABNORMAL HIGH (ref 0–99)
Triglycerides: 62 mg/dL (ref <150)
VLDL: 12 mg/dL (ref 0–40)

## 2015-12-20 LAB — ANTITHROMBIN III: ANTITHROMB III FUNC: 97 % (ref 75–120)

## 2015-12-20 LAB — FOLATE: Folate: 22.3 ng/mL (ref >5.9)

## 2015-12-20 MED ORDER — HYDROCHLOROTHIAZIDE 12.5 MG PO CAPS
12.5000 mg | ORAL_CAPSULE | Freq: Every day | ORAL | Status: DC
  Administered 2015-12-20 – 2015-12-21 (×2): 12.5 mg via ORAL
  Filled 2015-12-20 (×2): qty 1

## 2015-12-20 MED ORDER — ENOXAPARIN SODIUM 40 MG/0.4ML ~~LOC~~ SOLN
40.0000 mg | SUBCUTANEOUS | Status: DC
  Administered 2015-12-20 – 2015-12-21 (×2): 40 mg via SUBCUTANEOUS
  Filled 2015-12-20 (×2): qty 0.4

## 2015-12-20 MED ORDER — AMOXICILLIN-POT CLAVULANATE 875-125 MG PO TABS
1.0000 | ORAL_TABLET | Freq: Two times a day (BID) | ORAL | Status: DC
  Administered 2015-12-20 – 2015-12-21 (×3): 1 via ORAL
  Filled 2015-12-20 (×3): qty 1

## 2015-12-20 NOTE — Progress Notes (Signed)
Patient arrived to 5M20 at 87, A/OX4. Patient oriented to room, equipment, and all safety measures. Cardiac monitoring initiated. VSS. MAEW. Initial NIHSS 0. Will monitor closely overnight.

## 2015-12-20 NOTE — Progress Notes (Signed)
*  PRELIMINARY RESULTS* Vascular Ultrasound Carotid Duplex (Doppler) has been completed.  Preliminary findings: Bilateral: No significant (1-39%) ICA stenosis. Antegrade vertebral flow.   Landry Mellow, RDMS, RVT  12/20/2015, 2:02 PM

## 2015-12-20 NOTE — Progress Notes (Signed)
PT Cancellation Note  Patient Details Name: DAMARIE MACHORRO MRN: [id number] DOB: 1980-04-17   Cancelled Treatment:    Reason Eval/Treat Not Completed: PT screened, no needs identified, will sign off Per OT, pt is functioning at baseline and reports all symptoms have resolved. Per OT, pt does not require skilled PT services. Screened and signed off. Please re consult if any changes arise.    Marguarite Arbour A Hobert [place] 12/20/2015, 10:16 AM  Wray Kearns, PT, DPT [phone]

## 2015-12-20 NOTE — Progress Notes (Signed)
STROKE TEAM PROGRESS NOTE   HISTORY OF PRESENT ILLNESS (per record) Laurie Choi is an 35 y.o. female with no documented medical disorder who has been experiencing headaches and dizziness over the past several weeks intermittently. She woke up with headache this morning and at about 2:30p 12/19/2015 (LKW)  this afternoon she developed numbness involving her left side as well as speech output difficulty. Deficits subsequently resolved. There was no mention of focal weakness. No facial droop was reported. She has no history of stroke nor TIA. She has not been on antiplatelet therapy. CT scan of her head showed no acute intracranial abnormality. Extensive paranasal sinus disease was noted, most marked in the ethmoid air cells. NIH stroke score at the time of this evaluation was 0. Patient was not administered IV t-PA secondary to deficits resolved. She was admitted for further evaluation and treatment.   SUBJECTIVE (INTERVAL HISTORY) No family is at the bedside.  Overall she feels her condition is stable. She discussed her HPI with Dr. Leonie Man. She states she has a HA, feels it is associated with milk. She had recent HTN at the gym when they called EMS after trouble on the treadmill. No BCP, no smoke,    OBJECTIVE Temp:  [98.2 F (36.8 C)-99 F (37.2 C)] 98.6 F (37 C) (10/20 0500) Pulse Rate:  [82-96] 87 (10/20 0500) Cardiac Rhythm: Normal sinus rhythm (10/19 2031) Resp:  [13-24] 17 (10/20 0500) BP: (136-174)/(84-116) 138/84 (10/20 0500) SpO2:  [96 %-100 %] 98 % (10/20 0500) Weight:  [97.5 kg (215 lb)-99.3 kg (218 lb 14.7 oz)] 99.3 kg (218 lb 14.7 oz) (10/19 1939)  CBC:   Recent Labs Lab 12/19/15 1659 12/19/15 1711  WBC 7.3  --   NEUTROABS 5.0  --   HGB 13.0 13.3  HCT 39.1 39.0  MCV 86.7  --   PLT 286  --     Basic Metabolic Panel:   Recent Labs Lab 12/19/15 1659 12/19/15 1711  NA 138 143  K 3.4* 3.4*  CL 106 106  CO2 24  --   GLUCOSE 91 87  BUN 10 12  CREATININE  0.77 0.70  CALCIUM 8.8*  --     Lipid Panel: No results found for: CHOL, TRIG, HDL, CHOLHDL, VLDL, LDLCALC HgbA1c: No results found for: HGBA1C Urine Drug Screen:     Component Value Date/Time   LABOPIA NONE DETECTED 12/19/2015 1754   COCAINSCRNUR NONE DETECTED 12/19/2015 1754   LABBENZ NONE DETECTED 12/19/2015 1754   AMPHETMU NONE DETECTED 12/19/2015 1754   THCU NONE DETECTED 12/19/2015 1754   LABBARB NONE DETECTED 12/19/2015 1754      IMAGING  Dg Chest 2 View 12/19/2015 No active cardiopulmonary disease.   Ct Head Wo Contrast 12/19/2015 Extensive paranasal sinus disease, most marked in the ethmoid air cell complexes bilaterally. No intracranial mass, hemorrhage, or extra-axial fluid collection. Gray-white compartments appear unremarkable. No acute infarct evident.   Mr Brain Wo Contrast 12/20/2015 1. Normal brain MRI. 3. Pan sinus disease as above. Fluid level within the right maxillary sinus suggests acute sinusitis.   Mr Jodene Nam Head/brain Wo Cm 12/20/2015 2. Normal intracranial MRA.    PHYSICAL EXAM Pleasant obese young african [place] lady not in distress. . Afebrile. Head is nontraumatic. Neck is supple without bruit.    Cardiac exam no murmur or gallop. Lungs are clear to auscultation. Distal pulses are well felt. Neurological Exam ;  Awake  Alert oriented x 3. Normal speech and language.eye movements full without nystagmus.fundi were  not visualized. Vision acuity and fields appear normal. Hearing is normal. Palatal movements are normal. Face symmetric. Tongue midline. Normal strength, tone, reflexes and coordination. Normal sensation. Gait deferred.  ASSESSMENT/PLAN Ms. Laurie Choi is a 35 y.o. female with no significant past medical history presenting with headaches, dizziness and left arm numbness and tingling. She did not receive IV t-PA due to deficits resolved.   Atypical Migraine, doubt TIA  MRI  No acute stroke  MRA  Unremarkable   Carotid  Doppler  pending   2D Echo  pending   LDL pending   HgbA1c pending  Lovenox 40 mg sq daily for VTE prophylaxis Diet regular Room service appropriate? Yes; Fluid consistency: Thin  No antithrombotic prior to admission, now on aspirin 325 mg daily . Given no risk factors, no need to continue aspirin at time of discharge.   For comprehensive assessment, Complete stroke workup  Ongoing aggressive stroke risk factor management  Therapy recommendations:  No OT  Disposition:  Plan return home  Hypertensive Urgency  BP 174/116 in setting of neurologic symptoms  Stable this am  BP goal normotensive  Other Stroke Risk Factors  Morbid Obesity, Body mass index is 40.04 kg/m., recommend weight loss, diet and exercise as appropriate   Other Active Problems  Sinusitis   Hospital day # 0  Radene Journey [facility] [place] for Pager information 12/20/2015 9:33 AM  I have personally examined this patient, reviewed notes, independently viewed imaging studies, participated in medical decision making and plan of care.ROS completed by me personally and pertinent positives fully documented  I have made any additions or clarifications directly to the above note. Agree with note above.  She clearly had prolonged headache followed by focal neurological symptoms likely compatible with a conjugated migraine episode. She has had some headaches in the past which have not been 9 months and migraines. Recommend continue ongoing stroke evaluation. The patient's headache frequency is not enough at the present time to justify prophylactic medications. She may follow-up as outpatient in the neurology clinic as necessary. Greater than 50% time during this 25 minute visit was spent in counseling and coordination of care about migraine and stroke risk, prevention and treatment  Antony Contras, MD Medical Director [place] Pager: [phone] 12/20/2015 1:04 PM  To contact  Stroke Continuity provider, please refer to [email or web address] After hours, contact General Neurology

## 2015-12-20 NOTE — Evaluation (Signed)
Occupational Therapy Evaluation and Discharge Patient Details Name: JANILA SYLLA MRN: [id number] DOB: January 08, 1981 Today's Date: 12/20/2015    History of Present Illness 35 y.o.femalewith no documented medical disorder who has been experiencing headaches and dizziness over the past several weeks intermittently. She woke up with headache then developed numbness involving her left side as well as speech output difficulty. Deficits subsequently resolved. CT scan of her head showed no acute intracranial abnormality.   Clinical Impression   Pt reports she was independent with ADL PTA. Currently pt overall mod I for ADL and functional mobility with no residual deficits. Pt noted to have increased SOB with functional mobility; SpO2=97-99% on RA. Educated pt on energy conservation strategies and gradually increasing activity. Pt planning to d/c home with intermittent supervision from family. No further acute OT needs identified; signing off at this time. Please re-consult if needs change. Thank you for this referral.    Follow Up Recommendations  No OT follow up;Supervision - Intermittent    Equipment Recommendations  None recommended by OT    Recommendations for Other Services       Precautions / Restrictions Precautions Precautions: None Restrictions Weight Bearing Restrictions: No      Mobility Bed Mobility Overal bed mobility: Modified Independent             General bed mobility comments: HOB elevated  Transfers Overall transfer level: Independent Equipment used: None                  Balance Overall balance assessment: No apparent balance deficits (not formally assessed)                                          ADL Overall ADL's : Modified independent                                       General ADL Comments: Pt able to complete higher level balance activities and functional mobility with mod I. Increased time  required due to SOB with mobility. SpO2=97-99% on RA following activity. Educated pt on energy conservation strategies and breathing techniques. Discussed gradually increasing activity level      Vision Vision Assessment?: No apparent visual deficits   Perception     Praxis      Pertinent Vitals/Pain Pain Assessment: No/denies pain     Hand Dominance Right   Extremity/Trunk Assessment Upper Extremity Assessment Upper Extremity Assessment: Overall WFL for tasks assessed   Lower Extremity Assessment Lower Extremity Assessment: Overall WFL for tasks assessed   Cervical / Trunk Assessment Cervical / Trunk Assessment: Normal   Communication Communication Communication: No difficulties   Cognition Arousal/Alertness: Awake/alert Behavior During Therapy: WFL for tasks assessed/performed Overall Cognitive Status: Within Functional Limits for tasks assessed                     General Comments       Exercises       Shoulder Instructions      Home Living Family/patient expects to be discharged to:: Private residence Living Arrangements: Spouse/significant other;Children (68 y.o., 18 y.o. daughters) Available Help at Discharge: Family;Available PRN/intermittently Type of Home: House Home Access: Level entry     Home Layout: Two level;Bed/bath upstairs Alternate Level Stairs-Number of Steps: 18   Bathroom  Shower/Tub: Tub/shower unit;Curtain Shower/tub characteristics: Scientist, water quality: None          Prior Functioning/Environment Level of Independence: Independent        Comments: does not work but drives and takes care of her young children        OT Problem List:     OT Treatment/Interventions:      OT Goals(Current goals can be found in the care plan section) Acute Rehab OT Goals Patient Stated Goal: return home OT Goal Formulation: All assessment and education complete, DC therapy  OT Frequency:      Barriers to D/C:            Co-evaluation              End of Session Nurse Communication: Mobility status;Other (comment) (increased SOB with mobility)  Activity Tolerance: Patient tolerated treatment well Patient left: in bed;with call bell/phone within reach   Time: [id number] OT Time Calculation (min): 20 min Charges:  OT General Charges $OT Visit: 1 Procedure OT Evaluation $OT Eval Low Complexity: 1 Procedure G-Codes: OT G-codes **NOT FOR INPATIENT CLASS** Functional Assessment Tool Used: Clinical judgement Functional Limitation: Self care Self Care Current Status [id number]): At least 1 percent but less than 20 percent impaired, limited or restricted Self Care Goal Status [id number]): At least 1 percent but less than 20 percent impaired, limited or restricted Self Care Discharge Status [id number]): At least 1 percent but less than 20 percent impaired, limited or restricted   Binnie Kand M.S., OTR/L Pager: [phone]  12/20/2015, 9:28 AM

## 2015-12-20 NOTE — Progress Notes (Signed)
Family Medicine Teaching Service Daily Progress Note Intern Pager: [phone]  Patient name: Laurie Choi Medical record number: [id number] Date of birth: March 14, 1980 Age: 35 y.o. Gender: female  Primary Care Provider: No PCP Per Patient Consultants: Neuro  Code Status: Full   Pt Overview and Major Events to Date:    Assessment and Plan: Laurie Choi is a 35 y.o. female with no past medical history presenting with headaches, dizziness, difficulty speaking, left arm numbness and tingling which had resolved upon admission and consistent with TIA. She has no known PMH. She does not have PCP.  #TIA vs complex migraines, resolved Patient's symptoms resolved prior to admission, with no focal neurologic deficit noted on exam. CT and MRI have been normal, headaches have resolved this morning. Lipid panel is pending. TSH is normal. Neurology recommend completion of stroke workup. Will follow up with PCP for migraines and history of congestion.  --ASA 325 mg  --Would consider Echo ideally prior to discharge or in the outpatient setting --F/u on carotids doppler  --F/u on lipid panel --A1c --Cardiac monitor --F/u on hypercoagulable lab ordered per neuro recommendation  #Hypertension Most likely secondary to TIA and permissive hypertensive. Still elevated this morning, but closer to her baseline. Most likely new diagnosis of hypertension. --Will start patient on HCTZ 12.5 mg  --Will follow up with PCP  #Sinusitis  CT head with extensive para nasal disease consistent with "sinus congestion" complain that patient had for about 2 weeks. --Continue Augmentin 875 mg twice a day  FEN/GI: NS, regular diet PPx: SCDs  Disposition: Anticipate discharge on 12/21/15 after completing workup  Subjective:  No acute events overnight. Patient had a good night, headaches have almost completely resolved this morning. Patient denies dizziness and weakness. Patient with good appetite this morning  and close to baseline.  Objective: Temp:  [98.2 F (36.8 C)-99.1 F (37.3 C)] 98.6 F (37 C) (10/20 1335) Pulse Rate:  [82-96] 89 (10/20 1335) Resp:  [13-24] 17 (10/20 1335) BP: (136-174)/(84-116) 149/92 (10/20 1335) SpO2:  [96 %-100 %] 99 % (10/20 1335) Weight:  [218 lb 14.7 oz (99.3 kg)] 218 lb 14.7 oz (99.3 kg) (10/19 1939)   Physical Exam: General Appearance:  Pleasant woman, in no acute distress and cooperative able to answer question and participate in exam. Head/face:  NCAT Eyes:  PERRL and EOMI Nose/Sinuses:  Nares normal. Septum midline. Mucosa normal. No drainage with some sinus tenderness/pressure. Mouth/Throat:  Mucosa moist, no lesions; pharynx without erythema, edema or exudate. Neck:  Supple, no mass, non-tender, no carotid bruits, no jvd, Adenopathy- absent Lungs:  Normal expansion.  Clear to auscultation.  No rales, rhonchi, or wheezing. Heart:  Normal S1, S2.  Regular rate and rhythm mild systolic murmur over RUSB Abdomen:  Soft, non-tender, normal bowel sounds; no bruits, organomegaly or masses. Skin:  Skin color, texture, turgor are normal; there are no bruises, rashes or lesions. Extremities: Extremities warm to touch, pink, with no edema. and pulses present in all extremities  Musculoskeletal:  Spine range of motion normal. Muscular strength intact 5/5. Neurologic:  Alert and oriented x 3, CN II-12 intact, motor 5/5 in all extremities, gait normal, reflexes normal and symmetric, strength and sensation grossly normal, no dysarthria, no pronator drift  Laboratory:  Recent Labs Lab 12/19/15 1659 12/19/15 1711  WBC 7.3  --   HGB 13.0 13.3  HCT 39.1 39.0  PLT 286  --     Recent Labs Lab 12/19/15 1659 12/19/15 1711 12/20/15 1010  NA 138  143  --   K 3.4* 3.4*  --   CL 106 106  --   CO2 24  --   --   BUN 10 12  --   CREATININE 0.77 0.70 0.69  CALCIUM 8.8*  --   --   PROT 6.8  --   --   BILITOT 0.8  --   --   ALKPHOS 61  --   --   ALT 13*  --   --    AST 18  --   --   GLUCOSE 91 87  --     Imaging/Diagnostic Tests: Dg Chest 2 View  Result Date: 12/19/2015 CLINICAL DATA:  Hypertension. EXAM: CHEST  2 VIEW COMPARISON:  Radiograph of April 27, 2014. FINDINGS: The heart size and mediastinal contours are within normal limits. Both lungs are clear. No pneumothorax or pleural effusion is noted. The visualized skeletal structures are unremarkable. IMPRESSION: No active cardiopulmonary disease. Electronically Signed   By: Marijo Conception, M.D.   On: 12/19/2015 16:41   Ct Head Wo Contrast  Result Date: 12/19/2015 CLINICAL DATA:  Headache with dizziness.  Left arm numbness. EXAM: CT HEAD WITHOUT CONTRAST TECHNIQUE: Contiguous axial images were obtained from the base of the skull through the vertex without intravenous contrast. COMPARISON:  None. FINDINGS: Brain: The ventricles are normal in size and configuration. The left lateral ventricle is marginally larger than the right lateral ventricle, an anatomic variant. There is no intracranial mass, hemorrhage, extra-axial fluid collection, or midline shift. Gray-white compartments appear normal. No acute infarct evident. Vascular: No hyperdense vessel.  No vascular calcification evident. Skull: Bony calvarium appears intact. Sinuses/Orbits: There is opacification with air-fluid level in the right maxillary antrum. There is extensive ethmoid sinus disease bilaterally. Frontal sinuses are essentially aplastic. There is mild mucosal thickening in the posterior left sphenoid region. Visualized orbits appear symmetric bilaterally. Other: Visualized mastoid air cells are clear. IMPRESSION: Extensive paranasal sinus disease, most marked in the ethmoid air cell complexes bilaterally. No intracranial mass, hemorrhage, or extra-axial fluid collection. Gray-white compartments appear unremarkable. No acute infarct evident. Electronically Signed   By: Lowella Grip III M.D.   On: 12/19/2015 16:58   Mr Brain Wo  Contrast  Result Date: 12/20/2015 CLINICAL DATA:  Initial evaluation for right-sided headache with left-sided tingling. EXAM: MRI HEAD WITHOUT CONTRAST MRA HEAD WITHOUT CONTRAST TECHNIQUE: Multiplanar, multiecho pulse sequences of the brain and surrounding structures were obtained without intravenous contrast. Angiographic images of the head were obtained using MRA technique without contrast. COMPARISON:  Prior CT from earlier the same day. FINDINGS: MRI HEAD FINDINGS Brain: Cerebral volume within normal limits for a age. Gray-white matter differentiation well maintained. No parenchymal signal abnormality identified. No abnormal foci of restricted diffusion to suggest acute or subacute ischemia. No evidence for acute or chronic intracranial hemorrhage. No areas of chronic infarction. No mass lesion, midline shift or mass effect. No hydrocephalus. No extra-axial fluid collection. Major dural sinuses are grossly patent. Pituitary gland and suprasellar region within normal limits. Vascular: Normal intracranial vascular flow voids are maintained. Skull and upper cervical spine: Craniocervical junction normal. Visualized upper cervical spine unremarkable. Bone marrow signal intensity within normal limits. No scalp soft tissue abnormality. Sinuses/Orbits: Globes and orbital soft tissues within normal limits. Extensive opacity throughout the anterior ethmoidal air cells with mucosal thickening within the max O sinuses. Mild mucosal thickening within the sphenoid sinuses as well. Fluid level within the right maxillary sinus. No mastoid effusion. Inner ear structures normal.  MRA HEAD FINDINGS ANTERIOR CIRCULATION: Distal cervical segments of the internal carotid arteries are patent with antegrade flow. Petrous, cavernous, and supraclinoid segments widely patent. A1 segments patent. Right A1 segment hypoplastic, which likely accounts for the slightly diminutive right ICA as compared to the left. Anterior communicating  artery normal. Anterior cerebral arteries well opacified to their distal aspects. M1 segments widely patent without stenosis or occlusion. MCA bifurcations normal. Distal MCA branches well opacified and symmetric. POSTERIOR CIRCULATION: Vertebral arteries patent to the vertebrobasilar junction. Posterior inferior cerebral arteries patent proximally. Basilar artery widely patent. Superior cerebellar and posterior cerebral arteries patent bilaterally. Small bilateral posterior communicating arteries noted. No aneurysm or vascular malformation. IMPRESSION: 1. Normal brain MRI. 2. Normal intracranial MRA. 3. Pan sinus disease as above. Fluid level within the right maxillary sinus suggests acute sinusitis. Electronically Signed   By: Jeannine Boga M.D.   On: 12/20/2015 01:28   Mr Jodene Nam Head/brain [id number] Cm  Result Date: 12/20/2015 CLINICAL DATA:  Initial evaluation for right-sided headache with left-sided tingling. EXAM: MRI HEAD WITHOUT CONTRAST MRA HEAD WITHOUT CONTRAST TECHNIQUE: Multiplanar, multiecho pulse sequences of the brain and surrounding structures were obtained without intravenous contrast. Angiographic images of the head were obtained using MRA technique without contrast. COMPARISON:  Prior CT from earlier the same day. FINDINGS: MRI HEAD FINDINGS Brain: Cerebral volume within normal limits for a age. Gray-white matter differentiation well maintained. No parenchymal signal abnormality identified. No abnormal foci of restricted diffusion to suggest acute or subacute ischemia. No evidence for acute or chronic intracranial hemorrhage. No areas of chronic infarction. No mass lesion, midline shift or mass effect. No hydrocephalus. No extra-axial fluid collection. Major dural sinuses are grossly patent. Pituitary gland and suprasellar region within normal limits. Vascular: Normal intracranial vascular flow voids are maintained. Skull and upper cervical spine: Craniocervical junction normal. Visualized upper  cervical spine unremarkable. Bone marrow signal intensity within normal limits. No scalp soft tissue abnormality. Sinuses/Orbits: Globes and orbital soft tissues within normal limits. Extensive opacity throughout the anterior ethmoidal air cells with mucosal thickening within the max O sinuses. Mild mucosal thickening within the sphenoid sinuses as well. Fluid level within the right maxillary sinus. No mastoid effusion. Inner ear structures normal. MRA HEAD FINDINGS ANTERIOR CIRCULATION: Distal cervical segments of the internal carotid arteries are patent with antegrade flow. Petrous, cavernous, and supraclinoid segments widely patent. A1 segments patent. Right A1 segment hypoplastic, which likely accounts for the slightly diminutive right ICA as compared to the left. Anterior communicating artery normal. Anterior cerebral arteries well opacified to their distal aspects. M1 segments widely patent without stenosis or occlusion. MCA bifurcations normal. Distal MCA branches well opacified and symmetric. POSTERIOR CIRCULATION: Vertebral arteries patent to the vertebrobasilar junction. Posterior inferior cerebral arteries patent proximally. Basilar artery widely patent. Superior cerebellar and posterior cerebral arteries patent bilaterally. Small bilateral posterior communicating arteries noted. No aneurysm or vascular malformation. IMPRESSION: 1. Normal brain MRI. 2. Normal intracranial MRA. 3. Pan sinus disease as above. Fluid level within the right maxillary sinus suggests acute sinusitis. Electronically Signed   By: Jeannine Boga M.D.   On: 12/20/2015 01:28   Marjie Skiff, MD 12/20/2015, 4:06 PM PGY-1, [place] Intern pager: [phone], text pages welcome

## 2015-12-20 NOTE — Care Management Note (Signed)
Case Management Note  Patient Details  Name: Laurie Choi MRN: [id number] Date of Birth: 10-13-1980  Subjective/Objective:    Pt in with r/o TIA. She is from home with spouse.  Pt is without PCP and insurance.               Action/Plan: CM consulted for assistance finding a PCP. CM spoke with MD from family practice and they are going to see the patient in their clinic. CM met with the patient and informed her of this and provided her with coupons for the current medications she is on. Pt states she can afford her meds with the coupons. CM will continue to follow.   Expected Discharge Date:                  Expected Discharge Plan:  Home/Self Care  In-House Referral:     Discharge planning Services     Post Acute Care Choice:    Choice offered to:     DME Arranged:    DME Agency:     HH Arranged:    HH Agency:     Status of Service:  In process, will continue to follow  If discussed at Long Length of Stay Meetings, dates discussed:    Additional Comments:  Pollie Friar, RN 12/20/2015, 3:57 PM

## 2015-12-21 ENCOUNTER — Observation Stay (HOSPITAL_BASED_OUTPATIENT_CLINIC_OR_DEPARTMENT_OTHER): Payer: Medicaid Other

## 2015-12-21 DIAGNOSIS — G43009 Migraine without aura, not intractable, without status migrainosus: Secondary | ICD-10-CM | POA: Diagnosis not present

## 2015-12-21 DIAGNOSIS — G459 Transient cerebral ischemic attack, unspecified: Secondary | ICD-10-CM | POA: Diagnosis not present

## 2015-12-21 LAB — BETA-2-GLYCOPROTEIN I ABS, IGG/M/A
Beta-2-Glycoprotein I IgA: <9 GPI IgA units (ref 0–25)
Beta-2-Glycoprotein I IgM: <9 GPI IgM units (ref 0–32)

## 2015-12-21 LAB — ECHOCARDIOGRAM COMPLETE
HEIGHTINCHES: 62 in
Weight: 3502.67 oz

## 2015-12-21 LAB — PROTEIN S, TOTAL: PROTEIN S AG TOTAL: 64 % (ref 60–150)

## 2015-12-21 LAB — LUPUS ANTICOAGULANT PANEL
DRVVT: 34.4 s (ref 0.0–47.0)
PTT LA: 39.4 s (ref 0.0–51.9)

## 2015-12-21 LAB — PROTEIN S ACTIVITY: Protein S Activity: 79 % (ref 63–140)

## 2015-12-21 LAB — PROTEIN C ACTIVITY: Protein C Activity: 118 % (ref 73–180)

## 2015-12-21 MED ORDER — ACETAMINOPHEN 325 MG PO TABS
650.0000 mg | ORAL_TABLET | Freq: Once | ORAL | Status: AC
  Administered 2015-12-21: 650 mg via ORAL

## 2015-12-21 MED ORDER — AMOXICILLIN-POT CLAVULANATE 875-125 MG PO TABS: 1.0000 | ORAL_TABLET | Freq: Two times a day (BID) | ORAL | 0 refills | Status: DC

## 2015-12-21 MED ORDER — HYDROCHLOROTHIAZIDE 12.5 MG PO CAPS: 12.5000 mg | ORAL_CAPSULE | Freq: Every day | ORAL | 0 refills | Status: DC

## 2015-12-21 MED ORDER — ACETAMINOPHEN 325 MG PO TABS
650.0000 mg | ORAL_TABLET | Freq: Four times a day (QID) | ORAL | Status: DC | PRN
  Filled 2015-12-21: qty 2

## 2015-12-21 NOTE — Progress Notes (Signed)
Patient reports headache on left side of head, that radiated to left eye and left ear. MD notified-med administered per order(see mar)

## 2015-12-21 NOTE — Progress Notes (Signed)
STROKE TEAM PROGRESS NOTE   HISTORY OF PRESENT ILLNESS (per record) Laurie Choi is an 35 y.o. female with no documented medical disorder who has been experiencing headaches and dizziness over the past several weeks intermittently. She woke up with headache this morning and at about 2:30p 12/19/2015 (LKW)  this afternoon she developed numbness involving her left side as well as speech output difficulty. Deficits subsequently resolved. There was no mention of focal weakness. No facial droop was reported. She has no history of stroke nor TIA. She had recent HTN noted at the gym when they called EMS after trouble on the treadmill. She has not been on antiplatelet therapy. CT scan of her head showed no acute intracranial abnormality. Extensive paranasal sinus disease was noted, most marked in the ethmoid air cells.  NIH stroke score at the time of this evaluation was 0. Patient was not administered IV t-PA secondary to deficits resolved. She was admitted for further evaluation and treatment.   SUBJECTIVE (INTERVAL HISTORY) No family is at the bedside.  Overall she feels her condition is stable.  She states she has a HA, feels it is associated with milk. She had recent HTN at the gym when they called EMS after trouble on the treadmill. No BCP, no smoking.  We discussed the definition of complicated migraine and I told her that the primary team planned for her discharge today.  If further similar symptoms, would recommend an MRI with contrast to rule out other causes for her symptoms.   OBJECTIVE Temp:  [98.1 F (36.7 C)-99.1 F (37.3 C)] 98.1 F (36.7 C) (10/21 0513) Pulse Rate:  [61-89] 79 (10/21 0513) Cardiac Rhythm: Normal sinus rhythm (10/20 1900) Resp:  [17-20] 20 (10/21 0513) BP: (133-161)/(80-100) 135/87 (10/21 0513) SpO2:  [96 %-99 %] 97 % (10/21 0513)  CBC:   Recent Labs Lab 12/19/15 1659 12/19/15 1711  WBC 7.3  --   NEUTROABS 5.0  --   HGB 13.0 13.3  HCT 39.1 39.0  MCV 86.7   --   PLT 286  --     Basic Metabolic Panel:   Recent Labs Lab 12/19/15 1659 12/19/15 1711 12/20/15 1010  NA 138 143  --   K 3.4* 3.4*  --   CL 106 106  --   CO2 24  --   --   GLUCOSE 91 87  --   BUN 10 12  --   CREATININE 0.77 0.70 0.69  CALCIUM 8.8*  --   --     Lipid Panel:     Component Value Date/Time   CHOL 200 12/20/2015 1108   TRIG 62 12/20/2015 1108   HDL 51 12/20/2015 1108   CHOLHDL 3.9 12/20/2015 1108   VLDL 12 12/20/2015 1108   LDLCALC 137 (H) 12/20/2015 1108   HgbA1c:  Lab Results  Component Value Date   HGBA1C 5.8 (H) 12/20/2015   Urine Drug Screen:     Component Value Date/Time   LABOPIA NONE DETECTED 12/19/2015 1754   COCAINSCRNUR NONE DETECTED 12/19/2015 1754   LABBENZ NONE DETECTED 12/19/2015 1754   AMPHETMU NONE DETECTED 12/19/2015 1754   THCU NONE DETECTED 12/19/2015 1754   LABBARB NONE DETECTED 12/19/2015 1754      IMAGING  Dg Chest 2 View 12/19/2015 No active cardiopulmonary disease.   Ct Head Wo Contrast 12/19/2015 Extensive paranasal sinus disease, most marked in the ethmoid air cell complexes bilaterally. No intracranial mass, hemorrhage, or extra-axial fluid collection. Gray-white compartments appear unremarkable. No acute infarct  evident.   Mr Brain Wo Contrast 12/20/2015 Normal brain MRI.  Pan sinus disease as above. Fluid level within the right maxillary sinus suggests acute sinusitis.   Mr Jodene Nam Head/brain Wo Cm 12/20/2015 Normal intracranial MRA.     PHYSICAL EXAM Pleasant obese young african [place] lady not in distress. . Afebrile. Head is nontraumatic. Neck is supple without bruit.    Cardiac exam no murmur or gallop. Lungs are clear to auscultation. Distal pulses are well felt. Neurological Exam ;  Awake  Alert oriented x 3. Normal speech and language.eye movements full without nystagmus.fundi were not visualized. Vision acuity and fields appear normal. Hearing is normal. Palatal movements are normal. Face  symmetric. Tongue midline. Normal strength, tone, reflexes and coordination. Normal sensation. Gait deferred.   ASSESSMENT/PLAN Laurie Choi is a 35 y.o. female with no significant past medical history except recent HTN noted at the gym when they called EMS after trouble on the treadmill presenting with headaches, dizziness and left arm numbness and tingling. She did not receive IV t-PA due to deficits resolved.   Atypical Migraine, doubt TIA  MRI  No acute stroke  MRA  Unremarkable   Carotid Doppler - No significant (1-39%) ICA stenosis. Antegrade vertebral flow. No cardiac source of emboli identified.  2D Echo - EF 55 - 60%.  LDL - 137   HgbA1c - 5.8  Lovenox 40 mg sq daily for VTE prophylaxis Diet regular Room service appropriate? Yes; Fluid consistency: Thin  No antithrombotic prior to admission, now on aspirin 325 mg daily . Given no risk factors, no need to continue aspirin at time of discharge.   For comprehensive assessment, Complete stroke workup  Ongoing aggressive stroke risk factor management  Therapy recommendations:  No OT or PT recommended.  Disposition:  Plan return home  Hypertensive Urgency  BP 174/116 in setting of neurologic symptoms  Stable this am  BP goal normotensive  Now on HCTZ - Will need further follow up.  Other Stroke Risk Factors  Morbid Obesity, Body mass index is 40.04 kg/m., recommend weight loss, diet and exercise as appropriate   Other Active Problems  Sinusitis   Hospital day # 0   ATTENDING NOTE: Patient was seen and examined by me personally. Documentation reflects findings. The laboratory and radiographic studies reviewed by me. ROS completed by me personally and pertinent positives fully documented  Condition: stable  Assessment and plan completed by me personally and fully documented above. Plans/Recommendations include:     Likely complicated migraine.  Advised patient to return call 911 for acute  neurologic symptoms  Recommended she see a primary care doctor and maintain wellness checks  She plans to stop milk which seems to be "her trigger."  I advised that more than 5 migraines she be evaluated for migraine prevention versus abortive therapy  If additional events, would consider MRI with contrast to rule out demyelinating disease  SIGNED BY: Dr. Elissa Hefty     To contact Stroke Continuity provider, please refer to [email or web address] After hours, contact General Neurology

## 2015-12-21 NOTE — Progress Notes (Signed)
Family Medicine Teaching Service Daily Progress Note Intern Pager: [phone]  Patient name: Laurie Choi Medical record number: [id number] Date of birth: August 11, 1980 Age: 35 y.o. Gender: female  Primary Care Provider: No PCP Per Patient Consultants: Neuro  Code Status: Full   Pt Overview and Major Events to Date:   Assessment and Plan: LAIYLAH CHOKSHI is a 35 y.o. female with no past medical history presenting with headaches, dizziness, difficulty speaking, left arm numbness and tingling which had resolved upon admission and consistent with TIA. She has no known PMH. She does not have PCP.  #TIA vs complex migraines, resolved  Patient's symptoms resolved prior to admission, with no focal neurologic deficit noted on exam. CT and MRI have been normal, headaches have resolved this morning. Lipid panel wnl. TSH is normal. Hgb A1c 5.8.   Carotid dopplers negative for stenosis.  --ASA 325 mg  -d/c cardiac monitoring -Echocardiogram done this AM --F/u on hypercoagulable lab ordered per neuro recs  #Hypertension Most likely secondary to TIA and permissive hypertensive.  Most likely new diagnosis of hypertension.  Normotensive this AM.  --Started on HCTZ 12.5 mg 10/20 --Will follow up with PCP  #Sinusitis  CT head with extensive paranasal disease consistent with "sinus congestion" complain that patient had for about 2 weeks. --Continue Augmentin 875 mg twice a day    FEN/GI: NS, regular diet PPx: SCDs  Disposition: Anticipate discharge home today pending echo  Subjective:  No acute events overnight. Patient complaining of slight headache this morning.  Denies dizziness and weakness. Patient going for Echo. Otherwise ready to go home.   Objective: Temp:  [98.1 F (36.7 C)-99.1 F (37.3 C)] 98.1 F (36.7 C) (10/21 0513) Pulse Rate:  [61-89] 79 (10/21 0513) Resp:  [17-20] 20 (10/21 0513) BP: (133-161)/(80-100) 135/87 (10/21 0513) SpO2:  [96 %-99 %] 97 % (10/21 0513)    Physical Exam: General Appearance:  Pleasant woman, in NAD, cooperative able to answer question and participate in exam.  Head/face:  NCAT Eyes:  PERRL and EOMI Nose/Sinuses:  Nares normal. Septum midline. Mucosa normal. No drainage with some sinus tenderness/pressure. Mouth/Throat:  MMM, no lesions; oropharynx clear Neck:  Supple, no mass, NT, no carotid bruits, no JVD Lungs:  Normal work of breathing.  CTA.  No rales, rhonchi, or wheezing. Heart:  Normal S1, S2. RRR Abdomen:  Soft, NT, normal bowel sounds; no bruits, organomegaly or masses. Skin:  Skin color, texture, turgor are normal; there are no bruises, rashes or lesions. Extremities: Extremities warm to touch, pink, with no edema. and pulses present in all extremities  Musculoskeletal:  Spine range of motion normal. Muscular strength intact 5/5. Neurologic:  AAO x 3, CN II-XII intact, motor 5/5 in all extremities, gait normal, reflexes normal and symmetric, strength and sensation grossly normal, no dysarthria, no pronator drift  Laboratory:  Recent Labs Lab 12/19/15 1659 12/19/15 1711  WBC 7.3  --   HGB 13.0 13.3  HCT 39.1 39.0  PLT 286  --     Recent Labs Lab 12/19/15 1659 12/19/15 1711 12/20/15 1010  NA 138 143  --   K 3.4* 3.4*  --   CL 106 106  --   CO2 24  --   --   BUN 10 12  --   CREATININE 0.77 0.70 0.69  CALCIUM 8.8*  --   --   PROT 6.8  --   --   BILITOT 0.8  --   --   ALKPHOS 61  --   --  ALT 13*  --   --   AST 18  --   --   GLUCOSE 91 87  --     Imaging/Diagnostic Tests: Dg Chest 2 View  Result Date: 12/19/2015 CLINICAL DATA:  Hypertension. EXAM: CHEST  2 VIEW COMPARISON:  Radiograph of April 27, 2014. FINDINGS: The heart size and mediastinal contours are within normal limits. Both lungs are clear. No pneumothorax or pleural effusion is noted. The visualized skeletal structures are unremarkable. IMPRESSION: No active cardiopulmonary disease. Electronically Signed   By: Marijo Conception,  M.D.   On: 12/19/2015 16:41   Ct Head Wo Contrast  Result Date: 12/19/2015 CLINICAL DATA:  Headache with dizziness.  Left arm numbness. EXAM: CT HEAD WITHOUT CONTRAST TECHNIQUE: Contiguous axial images were obtained from the base of the skull through the vertex without intravenous contrast. COMPARISON:  None. FINDINGS: Brain: The ventricles are normal in size and configuration. The left lateral ventricle is marginally larger than the right lateral ventricle, an anatomic variant. There is no intracranial mass, hemorrhage, extra-axial fluid collection, or midline shift. Gray-white compartments appear normal. No acute infarct evident. Vascular: No hyperdense vessel.  No vascular calcification evident. Skull: Bony calvarium appears intact. Sinuses/Orbits: There is opacification with air-fluid level in the right maxillary antrum. There is extensive ethmoid sinus disease bilaterally. Frontal sinuses are essentially aplastic. There is mild mucosal thickening in the posterior left sphenoid region. Visualized orbits appear symmetric bilaterally. Other: Visualized mastoid air cells are clear. IMPRESSION: Extensive paranasal sinus disease, most marked in the ethmoid air cell complexes bilaterally. No intracranial mass, hemorrhage, or extra-axial fluid collection. Gray-white compartments appear unremarkable. No acute infarct evident. Electronically Signed   By: Lowella Grip III M.D.   On: 12/19/2015 16:58   Mr Brain Wo Contrast  Result Date: 12/20/2015 CLINICAL DATA:  Initial evaluation for right-sided headache with left-sided tingling. EXAM: MRI HEAD WITHOUT CONTRAST MRA HEAD WITHOUT CONTRAST TECHNIQUE: Multiplanar, multiecho pulse sequences of the brain and surrounding structures were obtained without intravenous contrast. Angiographic images of the head were obtained using MRA technique without contrast. COMPARISON:  Prior CT from earlier the same day. FINDINGS: MRI HEAD FINDINGS Brain: Cerebral volume within  normal limits for a age. Gray-white matter differentiation well maintained. No parenchymal signal abnormality identified. No abnormal foci of restricted diffusion to suggest acute or subacute ischemia. No evidence for acute or chronic intracranial hemorrhage. No areas of chronic infarction. No mass lesion, midline shift or mass effect. No hydrocephalus. No extra-axial fluid collection. Major dural sinuses are grossly patent. Pituitary gland and suprasellar region within normal limits. Vascular: Normal intracranial vascular flow voids are maintained. Skull and upper cervical spine: Craniocervical junction normal. Visualized upper cervical spine unremarkable. Bone marrow signal intensity within normal limits. No scalp soft tissue abnormality. Sinuses/Orbits: Globes and orbital soft tissues within normal limits. Extensive opacity throughout the anterior ethmoidal air cells with mucosal thickening within the max O sinuses. Mild mucosal thickening within the sphenoid sinuses as well. Fluid level within the right maxillary sinus. No mastoid effusion. Inner ear structures normal. MRA HEAD FINDINGS ANTERIOR CIRCULATION: Distal cervical segments of the internal carotid arteries are patent with antegrade flow. Petrous, cavernous, and supraclinoid segments widely patent. A1 segments patent. Right A1 segment hypoplastic, which likely accounts for the slightly diminutive right ICA as compared to the left. Anterior communicating artery normal. Anterior cerebral arteries well opacified to their distal aspects. M1 segments widely patent without stenosis or occlusion. MCA bifurcations normal. Distal MCA branches well opacified and  symmetric. POSTERIOR CIRCULATION: Vertebral arteries patent to the vertebrobasilar junction. Posterior inferior cerebral arteries patent proximally. Basilar artery widely patent. Superior cerebellar and posterior cerebral arteries patent bilaterally. Small bilateral posterior communicating arteries noted.  No aneurysm or vascular malformation. IMPRESSION: 1. Normal brain MRI. 2. Normal intracranial MRA. 3. Pan sinus disease as above. Fluid level within the right maxillary sinus suggests acute sinusitis. Electronically Signed   By: Jeannine Boga M.D.   On: 12/20/2015 01:28   Mr Jodene Nam Head/brain [id number] Cm  Result Date: 12/20/2015 CLINICAL DATA:  Initial evaluation for right-sided headache with left-sided tingling. EXAM: MRI HEAD WITHOUT CONTRAST MRA HEAD WITHOUT CONTRAST TECHNIQUE: Multiplanar, multiecho pulse sequences of the brain and surrounding structures were obtained without intravenous contrast. Angiographic images of the head were obtained using MRA technique without contrast. COMPARISON:  Prior CT from earlier the same day. FINDINGS: MRI HEAD FINDINGS Brain: Cerebral volume within normal limits for a age. Gray-white matter differentiation well maintained. No parenchymal signal abnormality identified. No abnormal foci of restricted diffusion to suggest acute or subacute ischemia. No evidence for acute or chronic intracranial hemorrhage. No areas of chronic infarction. No mass lesion, midline shift or mass effect. No hydrocephalus. No extra-axial fluid collection. Major dural sinuses are grossly patent. Pituitary gland and suprasellar region within normal limits. Vascular: Normal intracranial vascular flow voids are maintained. Skull and upper cervical spine: Craniocervical junction normal. Visualized upper cervical spine unremarkable. Bone marrow signal intensity within normal limits. No scalp soft tissue abnormality. Sinuses/Orbits: Globes and orbital soft tissues within normal limits. Extensive opacity throughout the anterior ethmoidal air cells with mucosal thickening within the max O sinuses. Mild mucosal thickening within the sphenoid sinuses as well. Fluid level within the right maxillary sinus. No mastoid effusion. Inner ear structures normal. MRA HEAD FINDINGS ANTERIOR CIRCULATION: Distal cervical  segments of the internal carotid arteries are patent with antegrade flow. Petrous, cavernous, and supraclinoid segments widely patent. A1 segments patent. Right A1 segment hypoplastic, which likely accounts for the slightly diminutive right ICA as compared to the left. Anterior communicating artery normal. Anterior cerebral arteries well opacified to their distal aspects. M1 segments widely patent without stenosis or occlusion. MCA bifurcations normal. Distal MCA branches well opacified and symmetric. POSTERIOR CIRCULATION: Vertebral arteries patent to the vertebrobasilar junction. Posterior inferior cerebral arteries patent proximally. Basilar artery widely patent. Superior cerebellar and posterior cerebral arteries patent bilaterally. Small bilateral posterior communicating arteries noted. No aneurysm or vascular malformation. IMPRESSION: 1. Normal brain MRI. 2. Normal intracranial MRA. 3. Pan sinus disease as above. Fluid level within the right maxillary sinus suggests acute sinusitis. Electronically Signed   By: Jeannine Boga M.D.   On: 12/20/2015 01:28   Lovenia Kim, MD 12/21/2015, 8:54 AM PGY-1, [place] Intern pager: [phone], text pages welcome

## 2015-12-21 NOTE — Progress Notes (Signed)
Patient is off the floor to vascular.

## 2015-12-21 NOTE — Progress Notes (Signed)
New order received, patient has already been screened out and identified with No Acute PT needs, will defer formal evaluation at this time. Alben Deeds, [place] DPT [phone]

## 2015-12-21 NOTE — Progress Notes (Signed)
  Echocardiogram 2D Echocardiogram has been performed.  Laurie Choi 12/21/2015, 11:41 AM

## 2015-12-21 NOTE — Discharge Instructions (Signed)
You were admitted to the hospital for an atypical migraine.  Neurology saw you to make sure it was not a stroke given your symptoms of weakness and difficulty speaking.  They think it is likely a migraine that caused it.  While you were here your blood pressures were noted to be high.  We started you on a medication (Hydrochlorothiazide) and would like you to continue taking that as prescribed to control your blood pressures.  Please follow up with your PCP following discharge. We are glad you are feeling better!

## 2015-12-21 NOTE — Discharge Summary (Signed)
[facility] Discharge Summary  Patient name: Laurie Choi Medical record number: [id number] Date of birth: May 31, 1980 Age: 35 y.o. Gender: female Date of Admission: 12/19/2015  Date of Discharge: 12/21/2015 Admitting Physician: Leeanne Rio, MD  Primary Care Provider: No PCP Per Patient Consultants: Neurology  Indication for Hospitalization: Headaches, arm weakness and difficulty speaking  Discharge Diagnoses/Problem List:  TIA/complex migraines headache Hypertension  Disposition: Home   Discharge Condition: Stable   Discharge Exam:  General Appearance: Pleasant woman, in NAD, cooperative able to answer question and participate in exam.  Head/face: NCAT Eyes: PERRL and EOMI Nose/Sinuses:Nares normal. Septum midline. Mucosa normal. No drainage with somesinus tenderness/pressure. Mouth/Throat:MMM, no lesions; oropharynx clear Neck: Supple, no mass, NT, no carotidbruits, no JVD Lungs:Normal work of breathing. CTA. No rales, rhonchi, or wheezing. Heart: Normal S1, S2. RRR Abdomen:Soft, NT, normal bowel sounds; no bruits, organomegaly or masses. Skin: Skin color, texture, turgor are normal; there are no bruises, rashes or lesions. Extremities:Extremities warm to touch, pink, with no edema. and pulses presentin all extremities  Musculoskeletal:Spine range of motion normal. Muscular strength intact 5/5. Neurologic:AAO x 3, CN II-XII intact, motor 5/5 in all extremities,gait normal, reflexes normal and symmetric, strength and sensation grossly normal, no dysarthria, no pronator drift  Brief Hospital Course:  Laurie Choi is a 35 y.o. female with no past medical history presenting with headaches, dizziness, difficulty speaking, left arm numbness and tingling which had resolved upon admission suspicious for TIA vs complex migraines.   #TIA/ complex migraines, Resolved.  Patient presented with a two week history of  headaches. On day of admission, patient reported headaches 10/10 sharp and mostly frontal accompanied with some dizziness. In addition, patient experienced left arm numbness and tingling and expressive aphasia with both symptoms quickly resolving in the matter of 15-30 minutes. Upon admission Neurology was consulted, CT and MRI had normal findings and headaches have resolved.  TSH is normal. ECHO show moderate LVH with EF of 55%-65% . Doppler US of carotids showed no ICA stenosis. Patient stable on discharge day, will follow up with PCP for further work up of possible migraines. --Follow up with neurology if symptoms are suggestive of another possible TIA  #Hypertension, new diagnosis Patient was normotensive about 2 years ago when she came to ED for dyspnea. Unclear if the elevated blood pressure is underlying hypertension or response to CVA. Patient continue to have elevated BP 48 hrs post TIA like event. Will assume hypertension has been a contributing factor in chronic headaches. --Start patient on HCTZ 12.5 mg --Will reassess treatment regimen at next PCP visit  #Sinusitis:  CT head with extensive para nasal disease.Patient also endorses facial pressure, headaches and congestion for the past 2 weeks. -- Complete Augmentin 875 mg twice a day for 7-10 days, DAY 1 (10/20)  Issues for Follow Up:  1. Follow up on completion of antibiotic course 2. Establish care with PCP, welcome to see her at [facility].  3. Follow up with neurology as needed,  4. Assess headaches/migraines history and need for prophylaxis 5. Reasses BP and need for BP medications adjustment 6. Consider statin with new diagnosis of hypertension, possible TIA, and elevated cholesterol  Significant Procedures:  ECHO MRA/MRI Carotid doppler  Significant Labs and Imaging:   Recent Labs Lab 12/19/15 1659 12/19/15 1711  WBC 7.3  --   HGB 13.0 13.3  HCT 39.1 39.0  PLT 286  --     Recent Labs Lab 12/19/15 1659  12/19/15 1711  12/20/15 1010  NA 138 143  --   K 3.4* 3.4*  --   CL 106 106  --   CO2 24  --   --   GLUCOSE 91 87  --   BUN 10 12  --   CREATININE 0.77 0.70 0.69  CALCIUM 8.8*  --   --   ALKPHOS 61  --   --   AST 18  --   --   ALT 13*  --   --   ALBUMIN 3.9  --   --    Dg Chest 2 View  Result Date: 12/19/2015 CLINICAL DATA:  Hypertension. EXAM: CHEST  2 VIEW COMPARISON:  Radiograph of April 27, 2014. FINDINGS: The heart size and mediastinal contours are within normal limits. Both lungs are clear. No pneumothorax or pleural effusion is noted. The visualized skeletal structures are unremarkable. IMPRESSION: No active cardiopulmonary disease. Electronically Signed   By: Marijo Conception, M.D.   On: 12/19/2015 16:41   Ct Head Wo Contrast  Result Date: 12/19/2015 CLINICAL DATA:  Headache with dizziness.  Left arm numbness. EXAM: CT HEAD WITHOUT CONTRAST TECHNIQUE: Contiguous axial images were obtained from the base of the skull through the vertex without intravenous contrast. COMPARISON:  None. FINDINGS: Brain: The ventricles are normal in size and configuration. The left lateral ventricle is marginally larger than the right lateral ventricle, an anatomic variant. There is no intracranial mass, hemorrhage, extra-axial fluid collection, or midline shift. Gray-white compartments appear normal. No acute infarct evident. Vascular: No hyperdense vessel.  No vascular calcification evident. Skull: Bony calvarium appears intact. Sinuses/Orbits: There is opacification with air-fluid level in the right maxillary antrum. There is extensive ethmoid sinus disease bilaterally. Frontal sinuses are essentially aplastic. There is mild mucosal thickening in the posterior left sphenoid region. Visualized orbits appear symmetric bilaterally. Other: Visualized mastoid air cells are clear. IMPRESSION: Extensive paranasal sinus disease, most marked in the ethmoid air cell complexes bilaterally. No intracranial mass,  hemorrhage, or extra-axial fluid collection. Gray-white compartments appear unremarkable. No acute infarct evident. Electronically Signed   By: Lowella Grip III M.D.   On: 12/19/2015 16:58   Mr Brain Wo Contrast  Result Date: 12/20/2015 CLINICAL DATA:  Initial evaluation for right-sided headache with left-sided tingling. EXAM: MRI HEAD WITHOUT CONTRAST MRA HEAD WITHOUT CONTRAST TECHNIQUE: Multiplanar, multiecho pulse sequences of the brain and surrounding structures were obtained without intravenous contrast. Angiographic images of the head were obtained using MRA technique without contrast. COMPARISON:  Prior CT from earlier the same day. FINDINGS: MRI HEAD FINDINGS Brain: Cerebral volume within normal limits for a age. Gray-white matter differentiation well maintained. No parenchymal signal abnormality identified. No abnormal foci of restricted diffusion to suggest acute or subacute ischemia. No evidence for acute or chronic intracranial hemorrhage. No areas of chronic infarction. No mass lesion, midline shift or mass effect. No hydrocephalus. No extra-axial fluid collection. Major dural sinuses are grossly patent. Pituitary gland and suprasellar region within normal limits. Vascular: Normal intracranial vascular flow voids are maintained. Skull and upper cervical spine: Craniocervical junction normal. Visualized upper cervical spine unremarkable. Bone marrow signal intensity within normal limits. No scalp soft tissue abnormality. Sinuses/Orbits: Globes and orbital soft tissues within normal limits. Extensive opacity throughout the anterior ethmoidal air cells with mucosal thickening within the max O sinuses. Mild mucosal thickening within the sphenoid sinuses as well. Fluid level within the right maxillary sinus. No mastoid effusion. Inner ear structures normal. MRA HEAD FINDINGS ANTERIOR CIRCULATION: Distal cervical segments  of the internal carotid arteries are patent with antegrade flow. Petrous,  cavernous, and supraclinoid segments widely patent. A1 segments patent. Right A1 segment hypoplastic, which likely accounts for the slightly diminutive right ICA as compared to the left. Anterior communicating artery normal. Anterior cerebral arteries well opacified to their distal aspects. M1 segments widely patent without stenosis or occlusion. MCA bifurcations normal. Distal MCA branches well opacified and symmetric. POSTERIOR CIRCULATION: Vertebral arteries patent to the vertebrobasilar junction. Posterior inferior cerebral arteries patent proximally. Basilar artery widely patent. Superior cerebellar and posterior cerebral arteries patent bilaterally. Small bilateral posterior communicating arteries noted. No aneurysm or vascular malformation. IMPRESSION: 1. Normal brain MRI. 2. Normal intracranial MRA. 3. Pan sinus disease as above. Fluid level within the right maxillary sinus suggests acute sinusitis. Electronically Signed   By: Jeannine Boga M.D.   On: 12/20/2015 01:28   Mr Jodene Nam Head/brain [id number] Cm  Result Date: 12/20/2015 CLINICAL DATA:  Initial evaluation for right-sided headache with left-sided tingling. EXAM: MRI HEAD WITHOUT CONTRAST MRA HEAD WITHOUT CONTRAST TECHNIQUE: Multiplanar, multiecho pulse sequences of the brain and surrounding structures were obtained without intravenous contrast. Angiographic images of the head were obtained using MRA technique without contrast. COMPARISON:  Prior CT from earlier the same day. FINDINGS: MRI HEAD FINDINGS Brain: Cerebral volume within normal limits for a age. Gray-white matter differentiation well maintained. No parenchymal signal abnormality identified. No abnormal foci of restricted diffusion to suggest acute or subacute ischemia. No evidence for acute or chronic intracranial hemorrhage. No areas of chronic infarction. No mass lesion, midline shift or mass effect. No hydrocephalus. No extra-axial fluid collection. Major dural sinuses are grossly  patent. Pituitary gland and suprasellar region within normal limits. Vascular: Normal intracranial vascular flow voids are maintained. Skull and upper cervical spine: Craniocervical junction normal. Visualized upper cervical spine unremarkable. Bone marrow signal intensity within normal limits. No scalp soft tissue abnormality. Sinuses/Orbits: Globes and orbital soft tissues within normal limits. Extensive opacity throughout the anterior ethmoidal air cells with mucosal thickening within the max O sinuses. Mild mucosal thickening within the sphenoid sinuses as well. Fluid level within the right maxillary sinus. No mastoid effusion. Inner ear structures normal. MRA HEAD FINDINGS ANTERIOR CIRCULATION: Distal cervical segments of the internal carotid arteries are patent with antegrade flow. Petrous, cavernous, and supraclinoid segments widely patent. A1 segments patent. Right A1 segment hypoplastic, which likely accounts for the slightly diminutive right ICA as compared to the left. Anterior communicating artery normal. Anterior cerebral arteries well opacified to their distal aspects. M1 segments widely patent without stenosis or occlusion. MCA bifurcations normal. Distal MCA branches well opacified and symmetric. POSTERIOR CIRCULATION: Vertebral arteries patent to the vertebrobasilar junction. Posterior inferior cerebral arteries patent proximally. Basilar artery widely patent. Superior cerebellar and posterior cerebral arteries patent bilaterally. Small bilateral posterior communicating arteries noted. No aneurysm or vascular malformation. IMPRESSION: 1. Normal brain MRI. 2. Normal intracranial MRA. 3. Pan sinus disease as above. Fluid level within the right maxillary sinus suggests acute sinusitis. Electronically Signed   By: Jeannine Boga M.D.   On: 12/20/2015 01:28   Results/Tests Pending at Time of Discharge:  None  Discharge Medications:    Medication List    TAKE these medications    amoxicillin-clavulanate 875-125 MG tablet Commonly known as:  AUGMENTIN Take 1 tablet by mouth 2 (two) times daily.   hydrochlorothiazide 12.5 MG capsule Commonly known as:  MICROZIDE Take 1 capsule (12.5 mg total) by mouth daily.   ibuprofen 200 MG tablet Commonly known  as:  ADVIL,MOTRIN Take 600 mg by mouth every 6 (six) hours as needed.   meclizine 50 MG tablet Commonly known as:  ANTIVERT Take 1 tablet (50 mg total) by mouth 3 (three) times daily as needed.   naproxen 500 MG tablet Commonly known as:  NAPROSYN Take 1 tablet (500 mg total) by mouth 2 (two) times daily.       Discharge Instructions: Please refer to Patient Instructions section of EMR for full details.  Patient was counseled important signs and symptoms that should prompt return to medical care, changes in medications, dietary instructions, activity restrictions, and follow up appointments.   Follow-Up Appointments: Follow-up [place], [place]. Go on 12/24/2015.   Specialty:  Family Medicine Why:  Hospital follow up at [facility] information: [address] [place] [address] [phone]           Marjie Skiff, MD 12/23/2015, 10:19 PM PGY-1, [place]

## 2015-12-22 LAB — PROTEIN C, TOTAL: PROTEIN C, TOTAL: 87 % (ref 60–150)

## 2015-12-22 LAB — CARDIOLIPIN ANTIBODIES, IGG, IGM, IGA
Anticardiolipin IgA: <9 APL U/mL (ref 0–11)
Anticardiolipin IgG: <9 GPL U/mL (ref 0–14)

## 2015-12-23 LAB — VAS US CAROTID
LCCADDIAS: -27 cm/s
LCCADSYS: -79 cm/s
LCCAPSYS: 99 cm/s
LEFT ECA DIAS: -16 cm/s
LEFT VERTEBRAL DIAS: 34 cm/s
LICADSYS: -93 cm/s
Left CCA prox dias: 26 cm/s
Left ICA dist dias: -43 cm/s
Left ICA prox dias: -35 cm/s
Left ICA prox sys: -78 cm/s
RCCADSYS: -79 cm/s
RCCAPDIAS: 14 cm/s
RCCAPSYS: 75 cm/s
RIGHT ECA DIAS: -20 cm/s
RIGHT VERTEBRAL DIAS: 19 cm/s

## 2015-12-23 LAB — HOMOCYSTEINE: HOMOCYSTEINE-NORM: 6.7 umol/L (ref 0.0–15.0)

## 2015-12-24 ENCOUNTER — Ambulatory Visit (INDEPENDENT_AMBULATORY_CARE_PROVIDER_SITE_OTHER): Payer: Self-pay | Admitting: Family Medicine

## 2015-12-24 VITALS — BP 120/82 | HR 91 | Temp 99.8°F | Ht 62.0 in | Wt 210.2 lb

## 2015-12-24 DIAGNOSIS — M79642 Pain in left hand: Secondary | ICD-10-CM

## 2015-12-24 DIAGNOSIS — I5030 Unspecified diastolic (congestive) heart failure: Secondary | ICD-10-CM

## 2015-12-24 DIAGNOSIS — F329 Major depressive disorder, single episode, unspecified: Secondary | ICD-10-CM | POA: Insufficient documentation

## 2015-12-24 DIAGNOSIS — M79641 Pain in right hand: Secondary | ICD-10-CM | POA: Insufficient documentation

## 2015-12-24 DIAGNOSIS — G43009 Migraine without aura, not intractable, without status migrainosus: Secondary | ICD-10-CM

## 2015-12-24 DIAGNOSIS — F32A Depression, unspecified: Secondary | ICD-10-CM | POA: Insufficient documentation

## 2015-12-24 DIAGNOSIS — F322 Major depressive disorder, single episode, severe without psychotic features: Secondary | ICD-10-CM

## 2015-12-24 DIAGNOSIS — E876 Hypokalemia: Secondary | ICD-10-CM

## 2015-12-24 DIAGNOSIS — G458 Other transient cerebral ischemic attacks and related syndromes: Secondary | ICD-10-CM

## 2015-12-24 LAB — BASIC METABOLIC PANEL WITH GFR
BUN: 13 mg/dL (ref 7–25)
CO2: 27 mmol/L (ref 20–31)
Calcium: 9.4 mg/dL (ref 8.6–10.2)
Chloride: 103 mmol/L (ref 98–110)
Creat: 0.77 mg/dL (ref 0.50–1.10)
GLUCOSE: 97 mg/dL (ref 65–99)
POTASSIUM: 3.7 mmol/L (ref 3.5–5.3)
SODIUM: 139 mmol/L (ref 135–146)

## 2015-12-24 MED ORDER — ATORVASTATIN CALCIUM 40 MG PO TABS: 40.0000 mg | ORAL_TABLET | Freq: Every day | ORAL | 3 refills | Status: DC

## 2015-12-24 MED ORDER — CARVEDILOL 3.125 MG PO TABS: 3.1250 mg | ORAL_TABLET | Freq: Two times a day (BID) | ORAL | 3 refills | Status: DC

## 2015-12-24 NOTE — Assessment & Plan Note (Signed)
-   Believed to be cause of symptoms during hospitalization. Symptoms resolved without further recurrence. -Red flags symptoms discussed. To call 911 if the symptoms occur -Follow-up to establish care. Consider migraine prophylaxis/abortive treatment

## 2015-12-24 NOTE — Assessment & Plan Note (Signed)
-  Reports pain and swelling and alternating hands. Workup for lupus negative. Consider obtaining further labs to work of rheumatoid arthritis-refuses this visit.  -Follow-up during flare so it can be better evaluated

## 2015-12-24 NOTE — Patient Instructions (Addendum)
Thank you so much for coming to visit today! I'm glad you're doing better since she left the hospital. Please continue to take your blood pressure medicine every day. Please let us know if you develop swelling, shortness of breath, difficulty walking. These continue to take your antibiotics until the course is complete. We will check your blood today to monitor your potassium level. You may take Benadryl as needed for hives and itching. Please return next time you have a flare of the swelling and pain in your hands. We can consider rheumatoid arthritis labs in the future.  Please return soon to establish care and review your full medical history.  Dr. Gerlean Ren

## 2015-12-24 NOTE — Assessment & Plan Note (Deleted)
-  Symptoms resolved -Atorvastatin initiated. Liver enzymes normal during hospitalization. -Red flags symptoms discussed. To call 911 if the symptoms occur -Follow-up to establish care

## 2015-12-24 NOTE — Progress Notes (Signed)
Subjective:     Patient ID: Laurie Choi, female   DOB: 10-29-1980, 35 y.o.   MRN: [id number]  HPI Laurie Choi is a 35 year old female presenting today for hospital follow-up. -Hospitalized from 12/19/2015 until 12/21/2015. Noted 2 week history of headache with acute onset left arm numbness and tingling and expressive aphasia. The symptoms resolved within 30 minutes. Echocardiogram showed moderate left ventricular hypertrophy, EF of 0000000, grade 2 diastolic dysfunction. CT scan and MRI were normal. Also noted to have a sinus infection and initiated on course of Augmentin. Completed TIA workup during hospitalization, however neurology feels the above symptoms were not due to a TIA or more likely caused by an atypical migraine. Neurology did not recommend statins or aspirin at discharge -Denies any further issues with numbness, tingling, aphasia since discharge -Continues to note occasional headache. Last occurred yesterday. Reports she hardly ever takes any medications for the headaches. Will take ibuprofen every 2-3 months. -Still taking her course of Augmentin. Not scheduled to be done until October 27. -Does report shortness of breath with exertion of the last several months. Also reports occasional paroxysmal nocturnal dyspnea. Denies chest pain, palpitations, edema, orthopnea. -Reports history of snoring. Family denies history of apnea. -Reports once a week she will have swelling and pain in one of her hands. Site of pain and swelling alternate. Not currently present. Reports mom has history of lupus and grandma has history of rheumatoid arthritis. Lupus workup negative. -Hypokalemia noted during hospitalization. Does report worsening cramping. -Reports increased depression. Notes little interest in doing things, feeling hopeless, difficulty sleeping. Denies suicidal or homicidal ideation at this time, but does admit to suicidal ideation occasionally. Denies plan  Review of Systems Per  HPI.    Objective:   Physical Exam  Constitutional: She is oriented to person, place, and time. She appears well-developed and well-nourished. No distress.  Cardiovascular: Normal rate and regular rhythm.   No murmur heard. Pulmonary/Chest: Effort normal. No respiratory distress. She has no wheezes.  Abdominal: Soft. She exhibits no distension. There is no tenderness.  Neurological: She is alert and oriented to person, place, and time. No cranial nerve deficit.  Muscle strength 5 out of 5 in upper and lower extremities. Sensation intact.      Assessment and Plan:     Heart failure with preserved ejection fraction ([place]) -Echocardiogram 12/21/2015 with EF 55-60%, moderate LVH, grade 2 diastolic dysfunction -Coreg initiated -Continue to monitor.  Bilateral hand pain -Reports pain and swelling and alternating hands. Workup for lupus negative. Consider obtaining further labs to work of rheumatoid arthritis-refuses this visit.  -Follow-up during flare so it can be better evaluated  Depression -PHQ 9 score 19 -Refuses medication at this time, but agreeable to start at next visit.  - Denies SI/HI at this time.  - [facility] to speak to patient prior to leaving. -Encouraged to follow-up soon  Atypical migraine - Believed to be cause of symptoms during hospitalization. Symptoms resolved without further recurrence. -Red flags symptoms discussed. To call 911 if the symptoms occur -Follow-up to establish care. Consider migraine prophylaxis/abortive treatment

## 2015-12-24 NOTE — Progress Notes (Signed)
Dr. Gerlean Ren requested a [place].   Presenting Issue:  Elevated PHQ-9  Report of symptoms:  Patient reports history of depression. She lost her mother when she was 35 and has lost several other family members since then, which has been difficult for her. She also feels guilty for feeling depressed because she has a husband and two daughters who she loves. She reports feeling down, feeling bad about herself, and feeling a lack of enjoyment in activities. Patient reported that she currently weighs more than she used to and feels down about this. She worries about her health and would like to lose weight and be healthier.   Duration of CURRENT symptoms:  Patient reports that her current depressive symptoms began when she experienced post-partum depression about 5 years ago.   Age of onset of first mood disturbance:  Patient reported experiencing depressive symptoms on and off since she lost her mother at age 35.   Psychiatric History - Diagnoses: Patient reported that she is "depressed" though has not been formally diagnosed. May benefit from diagnostic clarity from [facility] at future appt - Hospitalizations: psych hospitalizations not assessed - Pharmacotherapy: none, patient may be interested but is not sure - Outpatient therapy: Attended therapy for a couple of months about 7 years ago but did not find it helpful and stopped attending prematurely due to the high cost of the sessions.  Family history of psychiatric issues:  Not assessed  Current and history of substance use:  Not assessed  Medical conditions that might explain or contribute to symptoms:  Patient reported recently having "episode" in which her blood pressure spiked and she had to go to the hospital. She feels better now knowing that this was the result of a complex migraine.   Assessment / Plan / Recommendations: Patient appears to be experiencing some depressive symptoms that have gone untreated for several years.  Patient reported that prior to her recent "episode," she was aware of her depressive symptoms but did not feel treatment was a priority. She states that she is now motivated to seek out treatment for her depressed mood. Patient also expressed interest in being more active and losing weight. She would like to access [facility] services through [facility], but was unsure of when she wanted to come back to see [facility]. Patient plans to call the front desk to schedule with a [facility]. She was informed that [facility] providers are different depending on the day of the week and plans to find a consistent day of the week to come in so that she can establish a relationship with the same [facility].

## 2015-12-24 NOTE — Assessment & Plan Note (Signed)
-  PHQ 9 score 19 -Refuses medication at this time, but agreeable to start at next visit.  - Denies SI/HI at this time.  - [facility] to speak to patient prior to leaving. -Encouraged to follow-up soon

## 2015-12-24 NOTE — Assessment & Plan Note (Signed)
-  Echocardiogram 12/21/2015 with EF 55-60%, moderate LVH, grade 2 diastolic dysfunction -Coreg initiated -Continue to monitor.

## 2015-12-25 LAB — PROTHROMBIN GENE MUTATION

## 2015-12-25 LAB — FACTOR 5 LEIDEN

## 2016-01-06 ENCOUNTER — Ambulatory Visit (INDEPENDENT_AMBULATORY_CARE_PROVIDER_SITE_OTHER): Payer: Self-pay | Admitting: Family Medicine

## 2016-01-06 ENCOUNTER — Encounter: Payer: Self-pay | Admitting: Family Medicine

## 2016-01-06 VITALS — BP 98/60 | HR 113 | Temp 98.1°F | Wt 206.0 lb

## 2016-01-06 DIAGNOSIS — E860 Dehydration: Secondary | ICD-10-CM

## 2016-01-06 NOTE — Assessment & Plan Note (Signed)
Patient with history of hypertension presenting with hypotension to 98/60. Felt weak this morning, with some nausea and vomiting yesterday in the setting of a recent sinus infection.  Has had decreased by mouth intake for the past 2 days, very likely that reason for her hypertension is due to moderate dehydration. - PO fluids like Gatorade and to increase intake of solids as appropriate - Recommended to go to hospital if patient is unable to take anything by mouth over the next 24 hours to receive IV fluids

## 2016-01-06 NOTE — Patient Instructions (Signed)
Laurie Choi, you were seen today in clinic for some vomiting yesterday and weakness, decreased appetite and low blood pressure today.  You have been unable to keep much food down and have had no fluids today.  The reason you are feeling this way and have low blood pressure is likely because you are moderately dehydrated.  This is probably because of a viral infection, causing your sore throat, nausea and vomiting.   I do not think you need IV fluids at this point, but if you are unable to keep anything down by tomorrow morning I would come into the hospital.  Please try to drink lots of fluids (preferably things like gatorade) and try to eat as you can.  Also, you can take zyrtec for your itching or benadryl as needed.   Please check your blood pressure over the next couple of days.  If the pressures are <130/90 do not take your medicine.  Please follow up in two days for a nurse visit for blood pressure check.   It was nice meeting you, hope you feel better.    [name], [place] Resident PGY-1 01/06/2016 9:25 AM

## 2016-01-06 NOTE — Progress Notes (Signed)
    Subjective:  Laurie Choi is a 35 y.o. female who presents to the [facility] today with a chief complaint of fatigue and nausea  HPI:  Hypotension Patient reports feeling fatigued for the past 2 days and has had decreased PO intake.  Yesterday had some nausea/vomiting and this morning has not had anything to drink.  Was feeling a little lightheaded and dizzy and took her BP this morning and it was in the 90s/60s.  She decided to not take her BP medication.    Nausea/vomiting: Recently got over a sinusitis and was treated with augmentin, which resolved her symptoms.  Started having nausea/vomiting yesterday without any diarrhea, fevers or chills.    PMH: HTN, atypical migraines ROS: see HPI Smoking history and social history reviewed   Objective:  Physical Exam: BP 98/60   Pulse (!) 113   Temp 98.1 F (36.7 C) (Oral)   Wt 206 lb (93.4 kg)   LMP 12/18/2015   SpO2 99%   Breastfeeding? No   BMI 37.68 kg/m   Gen: 35 year old female in NAD, resting comfortably HEENT: No sinus tenderness CV: RRR with no murmurs appreciated Pulm: NWOB, CTAB with no crackles, wheezes, or rhonchi GI: Normal bowel sounds present. Soft, Nontender, Nondistended. MSK: no edema, cyanosis, or clubbing noted Skin: warm, dry Neuro: grossly normal, moves all extremities Psych: Normal affect and thought content  No results found for this or any previous visit (from the past 72 hour(s)).   Assessment/Plan:  Dehydration Patient with history of hypertension presenting with hypotension to 98/60. Felt weak this morning, with some nausea and vomiting yesterday in the setting of a recent sinus infection.  Has had decreased by mouth intake for the past 2 days, very likely that reason for her hypertension is due to moderate dehydration. - PO fluids like Gatorade and to increase intake of solids as appropriate - Recommended to go to hospital if patient is unable to take anything by mouth over the next 24 hours to  receive IV fluids

## 2016-03-19 ENCOUNTER — Emergency Department (HOSPITAL_COMMUNITY): Payer: BLUE CROSS/BLUE SHIELD

## 2016-03-19 ENCOUNTER — Emergency Department (HOSPITAL_COMMUNITY)
Admission: EM | Admit: 2016-03-19 | Discharge: 2016-03-20 | Disposition: A | Discharge status: Home / Self Care | Payer: BLUE CROSS/BLUE SHIELD | Attending: Emergency Medicine | Admitting: Emergency Medicine

## 2016-03-19 DIAGNOSIS — R0602 Shortness of breath: Secondary | ICD-10-CM | POA: Diagnosis not present

## 2016-03-19 DIAGNOSIS — L509 Urticaria, unspecified: Secondary | ICD-10-CM | POA: Diagnosis not present

## 2016-03-19 DIAGNOSIS — Z79899 Other long term (current) drug therapy: Secondary | ICD-10-CM | POA: Diagnosis not present

## 2016-03-19 DIAGNOSIS — R079 Chest pain, unspecified: Secondary | ICD-10-CM | POA: Diagnosis not present

## 2016-03-19 LAB — BASIC METABOLIC PANEL
ANION GAP: 10 (ref 5–15)
BUN: 10 mg/dL (ref 6–20)
CO2: 21 mmol/L — AB (ref 22–32)
Calcium: 8.7 mg/dL — ABNORMAL LOW (ref 8.9–10.3)
Chloride: 102 mmol/L (ref 101–111)
Creatinine, Ser: 0.67 mg/dL (ref 0.44–1.00)
GFR calc Af Amer: >60 mL/min (ref >60)
GFR calc non Af Amer: >60 mL/min (ref >60)
Glucose, Bld: 107 mg/dL — ABNORMAL HIGH (ref 65–99)
POTASSIUM: 3.4 mmol/L — AB (ref 3.5–5.1)
Sodium: 133 mmol/L — ABNORMAL LOW (ref 135–145)

## 2016-03-19 LAB — URINALYSIS, ROUTINE W REFLEX MICROSCOPIC
BACTERIA UA: NONE SEEN
BILIRUBIN URINE: NEGATIVE
Glucose, UA: NEGATIVE mg/dL
KETONES UR: NEGATIVE mg/dL
LEUKOCYTES UA: NEGATIVE
NITRITE: NEGATIVE
PH: 6 (ref 5.0–8.0)
Protein, ur: NEGATIVE mg/dL
RBC / HPF: NONE SEEN RBC/hpf (ref 0–5)
Specific Gravity, Urine: 1.012 (ref 1.005–1.030)

## 2016-03-19 LAB — CBC
HEMATOCRIT: 42.2 % (ref 36.0–46.0)
HEMOGLOBIN: 13.8 g/dL (ref 12.0–15.0)
MCH: 28 pg (ref 26.0–34.0)
MCHC: 32.7 g/dL (ref 30.0–36.0)
MCV: 85.8 fL (ref 78.0–100.0)
Platelets: 290 10*3/uL (ref 150–400)
RBC: 4.92 MIL/uL (ref 3.87–5.11)
RDW: 14.7 % (ref 11.5–15.5)
WBC: 5.6 10*3/uL (ref 4.0–10.5)

## 2016-03-19 LAB — I-STAT TROPONIN, ED: Troponin i, poc: 0 ng/mL (ref 0.00–0.08)

## 2016-03-19 LAB — BRAIN NATRIURETIC PEPTIDE: B Natriuretic Peptide: 19.5 pg/mL (ref 0.0–100.0)

## 2016-03-19 NOTE — ED Notes (Signed)
Pt reports recently being diagnosed with stage 2 heart failure but reports she is not currently on any treatment. Pt reports she is meant to see a cardiologist but has not yet made an appointment.

## 2016-03-19 NOTE — ED Provider Notes (Signed)
[facility] DEPT Provider Note   CSN: [id number] Arrival date & time: 03/19/16  2103  By signing my name below, I, Laurie Choi, attest that this documentation has been prepared under the direction and in the presence of Laurie Balls, MD . Electronically Signed: Evelene Choi, Scribe. 03/19/2016. 11:30 PM.   History   Chief Complaint Chief Complaint  Patient presents with  . Chest Pain  . Shortness of Breath   The history is provided by the patient. No language interpreter was used.     HPI Comments:  Laurie Choi is a 36 y.o. female who presents to the Emergency Department complaining of central CP x 3 days, worse today. She describes her pain as a tight, cramping, pinching sensation. She reports associated SOB, dyspnea on exertion,  fatigue, and aching upper back pain. No fever, cough, or BLE swelling. She was diagnosed with CHF in October 2017 but has not been started on meds. She states she was waiting until January 2018 for her insurance to become active so she could follow up with a cardiologist. Pt denies recent long periods of immobilization or recent surgeries.   Past Medical History:  Diagnosis Date  . Hyperemesis gravidarum before end of [redacted] week gestation, dehydration   . No pertinent past medical history     Patient Active Problem List   Diagnosis Date Noted  . Dehydration 01/06/2016  . Heart failure with preserved ejection fraction ([place]) 12/24/2015  . Bilateral hand pain 12/24/2015  . Depression 12/24/2015  . Atypical migraine 12/20/2015  . Severe obesity (BMI >= 40) ([facility])   . Elevated blood pressure reading   . SVD (spontaneous vaginal delivery) 10/19/2010    No past surgical history on file.  OB History    Gravida Para Term Preterm AB Living   2 2 1     2    SAB TAB Ectopic Multiple Live Births           1       Home Medications    Prior to Admission medications   Medication Sig Start Date End Date Taking? Authorizing Provider    amoxicillin-clavulanate (AUGMENTIN) 875-125 MG tablet Take 1 tablet by mouth 2 (two) times daily. Patient not taking: Reported on 01/06/2016 12/21/15   Lovenia Kim, MD  carvedilol (COREG) 3.125 MG tablet Take 1 tablet (3.125 mg total) by mouth 2 (two) times daily with a meal. Patient not taking: Reported on 01/06/2016 12/24/15   Burna Cash Rumley, DO  hydrochlorothiazide (MICROZIDE) 12.5 MG capsule Take 1 capsule (12.5 mg total) by mouth daily. 12/22/15   Lovenia Kim, MD  ibuprofen (ADVIL,MOTRIN) 200 MG tablet Take 600 mg by mouth every 6 (six) hours as needed.    Historical Provider, MD  meclizine (ANTIVERT) 50 MG tablet Take 1 tablet (50 mg total) by mouth 3 (three) times daily as needed. Patient not taking: Reported on 01/06/2016 04/27/14   Antonietta Breach, PA-C  naproxen (NAPROSYN) 500 MG tablet Take 1 tablet (500 mg total) by mouth 2 (two) times daily. Patient not taking: Reported on 01/06/2016 04/27/14   Antonietta Breach, PA-C    Family History Family History  Problem Relation Age of Onset  . Lupus Mother   . Hypertension Father   . Diabetes Father     Social History Social History  Substance Use Topics  . Smoking status: Never Smoker  . Smokeless tobacco: Never Used  . Alcohol use No     Allergies   Tape   Review of Systems  Review of Systems  10 systems reviewed and all are negative for acute change except as noted in the HPI.   Physical Exam Updated Vital Signs BP 137/87   Pulse 90   Temp 100.1 F (37.8 C)   Resp 15   Ht 5\' 3"  (1.6 m)   Wt 200 lb (90.7 kg)   LMP 03/19/2016   SpO2 98%   BMI 35.43 kg/m   Physical Exam  Constitutional: She is oriented to person, place, and time. She appears well-developed and well-nourished. No distress.  HENT:  Head: Normocephalic and atraumatic.  Nose: Nose normal.  Mouth/Throat: Oropharynx is clear and moist. No oropharyngeal exudate.  Eyes: Conjunctivae and EOM are normal. Pupils are equal, round, and reactive to light. No  scleral icterus.  Neck: Normal range of motion. Neck supple. No JVD present. No tracheal deviation present. No thyromegaly present.  Cardiovascular: Normal rate, regular rhythm and normal heart sounds.  Exam reveals no gallop and no friction rub.   No murmur heard. Pulmonary/Chest: Effort normal and breath sounds normal. No respiratory distress. She has no wheezes. She exhibits no tenderness.  Abdominal: Soft. Bowel sounds are normal. She exhibits no distension and no mass. There is no tenderness. There is no rebound and no guarding.  Musculoskeletal: Normal range of motion. She exhibits no edema or tenderness.  Lymphadenopathy:    She has no cervical adenopathy.  Neurological: She is alert and oriented to person, place, and time. No cranial nerve deficit. She exhibits normal muscle tone.  Skin: Skin is warm and dry. No rash noted. No erythema. No pallor.  Nursing note and vitals reviewed.    ED Treatments / Results  DIAGNOSTIC STUDIES:  Oxygen Saturation is 95% on RA, adequate by my interpretation.    COORDINATION OF CARE:  11:27 PM Discussed treatment plan with pt at bedside and pt agreed to plan.  Labs (all labs ordered are listed, but only abnormal results are displayed) Labs Reviewed  BASIC METABOLIC PANEL - Abnormal; Notable for the following:       Result Value   Sodium 133 (*)    Potassium 3.4 (*)    CO2 21 (*)    Glucose, Bld 107 (*)    Calcium 8.7 (*)    All other components within normal limits  URINALYSIS, ROUTINE W REFLEX MICROSCOPIC - Abnormal; Notable for the following:    Hgb urine dipstick MODERATE (*)    Squamous Epithelial / LPF 0-5 (*)    All other components within normal limits  CBC  BRAIN NATRIURETIC PEPTIDE  I-STAT TROPOININ, ED    EKG  EKG Interpretation  Date/Time:  Thursday March 19 2016 21:09:25 EST Ventricular Rate:  110 PR Interval:  148 QRS Duration: 72 QT Interval:  334 QTC Calculation: 452 R Axis:   36 Text Interpretation:   Sinus tachycardia Nonspecific T wave abnormality Abnormal ECG rate is faster , otherwise unchanged Confirmed by Glynn Octave [phone]) on 03/19/2016 11:08:02 PM       Radiology Dg Chest 2 View  Result Date: 03/19/2016 CLINICAL DATA:  Chest pain and shortness of breath. EXAM: CHEST  2 VIEW COMPARISON:  12/19/2015 FINDINGS: The cardiomediastinal contours are normal. The lungs are clear. Pulmonary vasculature is normal. No consolidation, pleural effusion, or pneumothorax. No acute osseous abnormalities are seen. IMPRESSION: No active cardiopulmonary disease. Electronically Signed   By: Jeb Levering M.D.   On: 03/19/2016 21:40    Procedures Procedures (including critical care time)  Medications Ordered in ED  Medications  predniSONE (DELTASONE) tablet 60 mg (not administered)     Initial Impression / Assessment and Plan / ED Course  I have reviewed the triage vital signs and the nursing notes.  Pertinent labs & imaging results that were available during my care of the patient were reviewed by me and considered in my medical decision making (see chart for details).      Patient presents to the ED for substernal CP and intermittnet back pain.  Upon chart review, she had an echo which shows grade 2 diastolic dysfunction, but no overt heart failure.  Her history is not concerning for ACS, troponin ordered by triage and not clinically indicated. It was negative.  No signs of overload on exam or CXR.  BNP is pending.  She also describes intermittent rash and hand swelling.  This may be an autoimmune process.  Will try a course of steroids and see if that helps.  Advise to fu with PCP again for further care.  12:26 AM BNP is 19.  Also she needs a refill of HCTZ which was given to her.  PCP fu advised within 3 days.  She appears well and in NAD.  VS remain within her normal limits and she is safe for DC. Tachycardia has resolved on its own without any acute interventions.   Final  Clinical Impressions(s) / ED Diagnoses   Final diagnoses:  None    New Prescriptions New Prescriptions   No medications on file   I personally performed the services described in this documentation, which was scribed in my presence. The recorded information has been reviewed and is accurate.       Laurie Balls, MD 03/20/16 [phone]

## 2016-03-19 NOTE — ED Notes (Signed)
ED Provider at bedside. 

## 2016-03-19 NOTE — ED Triage Notes (Signed)
Pt states that CP started on Tuesday along with SOB, radiation to back, pt has a hx of CHF, some nausea no vomiting, c/o diarrhea. Hand swelling earlier today.

## 2016-03-20 MED ORDER — PREDNISONE 20 MG PO TABS
60.0000 mg | ORAL_TABLET | Freq: Once | ORAL | Status: AC
  Administered 2016-03-20: 60 mg via ORAL
  Filled 2016-03-20: qty 3

## 2016-03-20 MED ORDER — HYDROCHLOROTHIAZIDE 25 MG PO TABS: 25.0000 mg | ORAL_TABLET | Freq: Every day | ORAL | 0 refills | Status: DC

## 2016-03-20 MED ORDER — PREDNISONE 20 MG PO TABS: 60.0000 mg | ORAL_TABLET | Freq: Every day | ORAL | 0 refills | Status: DC

## 2016-03-20 NOTE — ED Notes (Signed)
ED Provider at bedside. 

## 2016-03-23 ENCOUNTER — Ambulatory Visit (INDEPENDENT_AMBULATORY_CARE_PROVIDER_SITE_OTHER): Payer: BLUE CROSS/BLUE SHIELD | Admitting: Obstetrics and Gynecology

## 2016-03-23 ENCOUNTER — Encounter: Payer: Self-pay | Admitting: Obstetrics and Gynecology

## 2016-03-23 VITALS — BP 150/70 | HR 90 | Temp 98.4°F | Wt 208.8 lb

## 2016-03-23 DIAGNOSIS — R5383 Other fatigue: Secondary | ICD-10-CM | POA: Diagnosis not present

## 2016-03-23 DIAGNOSIS — R55 Syncope and collapse: Secondary | ICD-10-CM | POA: Diagnosis not present

## 2016-03-23 NOTE — Progress Notes (Signed)
Subjective:   Patient ID: Laurie Choi, female    DOB: 07-05-1980, 36 y.o.   MRN: [id number]  Patient presents for Same Day Appointment  Chief Complaint  Patient presents with  . Fatigue    HPI: # Fatigue Patient presents to clinic with concern for extreme fatigue. Patient states that she went to the hospital on Thursday because she wasn't feeling well. Was discharged from ED with diagnosis of uticaria and dyspnea. Patient states she still wasn't feeling well. States that she thought her heart failure was worsening as she was recently diagnosed with heart failure. Patient states this morning she woke up not feeling well. Says she felt like she just had to either vomit or have diarrhea with sudden onset. As she was walking she fell and hit her head on the bathroom floor. She also had vomiting with episode. Patient believes she lost consciousness because when she came to her husband was saying her name but she was unable to respond to him immediately. Her blood pressure was low when she checked her vitals. She believes she may have been dehydrated as she hasn't been drinking as much as usual. This is not her first syncopal episode as prior episode involves cardiac workup where she received echo diagnosing her with heart failure.  Patient currently denying any headache, cold, congestion, fever, myalgias, chest pain, palpitations, or dyspnea. Her only concern is her fatigue. No new medications.   Fatigue feels debilitating. Patient endorses snoring at night with daytime somnolence.   Review of Systems   See HPI for ROS.   History  Smoking Status  . Never Smoker  Smokeless Tobacco  . Never Used    Past medical history, surgical, family, and social history reviewed and updated in the EMR as appropriate.  Objective:  BP (!) 150/70   Pulse 90   Temp 98.4 F (36.9 C) (Oral)   Wt 208 lb 12.8 oz (94.7 kg)   LMP 03/19/2016 (Exact Date)   SpO2 97%   BMI 36.99 kg/m  Vitals and  nursing note reviewed  Physical Exam  Constitutional: She is well-developed, well-nourished, and in no distress.  HENT:  Mouth/Throat: Oropharynx is clear and moist.  Eyes: EOM are normal. Pupils are equal, round, and reactive to light.  Neck: Normal range of motion. Neck supple. No thyromegaly present.  Cardiovascular: Normal rate, regular rhythm, normal heart sounds and intact distal pulses.   Pulmonary/Chest: Effort normal and breath sounds normal. No respiratory distress. She has no wheezes.  Abdominal: Soft. There is no tenderness.  Musculoskeletal: Normal range of motion. She exhibits no edema.  Lymphadenopathy:    She has no cervical adenopathy.    Assessment & Plan:  1. Syncope, unspecified syncope type Concern for cardiac origin. EKG reviewed from ED without signs of arrhythmia or heart block. However believe patient would benefit from Holter monitor. Should also be reevaluated for her heart failure. Has not been following up with cardiologist. Echo showed grade 2 diastolic dysfunction with normal EF. Troponin in ED negative. BNP also normal. Will place urgent referral to cardiology. Vital signs are stable here without hypotension. Denying any current chest pain or dyspnea.  - Ambulatory referral to Cardiology  2. Other fatigue Fatigue could be multifactorial. Reviewed patient's chart that she has had normal labs including TSH, CBC, and electrolytes. Will collect vitamin D as this could be a source of fatigue. Patient also endorses symptoms of OSA which could be causing significant fatigue. Sleep study ordered. - Split night  study; Future - VITAMIN D 25 Hydroxy (Vit-D Deficiency, Fractures)  Precepted case with Dr. Ardelia Mems.  PATIENT EDUCATION PROVIDED: See AVS   Luiz Blare, DO 03/23/2016, 10:24 AM PGY-3, [facility]\

## 2016-03-23 NOTE — Patient Instructions (Signed)
Cardiology referral placed someone will call you about an appointment Will contact you about lab work Sleep study ordered someone will call to coordinate this   Sleep Apnea Sleep apnea is a condition in which breathing pauses or becomes shallow during sleep. Episodes of sleep apnea usually last 10 seconds or longer, and they may occur as many as 20 times an hour. Sleep apnea disrupts your sleep and keeps your body from getting the rest that it needs. This condition can increase your risk of certain health problems, including:  Heart attack.  Stroke.  Obesity.  Diabetes.  Heart failure.  Irregular heartbeat. There are three kinds of sleep apnea:  Obstructive sleep apnea. This kind is caused by a blocked or collapsed airway.  Central sleep apnea. This kind happens when the part of the brain that controls breathing does not send the correct signals to the muscles that control breathing.  Mixed sleep apnea. This is a combination of obstructive and central sleep apnea. What are the causes? The most common cause of this condition is a collapsed or blocked airway. An airway can collapse or become blocked if:  Your throat muscles are abnormally relaxed.  Your tongue and tonsils are larger than normal.  You are overweight.  Your airway is smaller than normal. What increases the risk? This condition is more likely to develop in people who:  Are overweight.  Smoke.  Have a smaller than normal airway.  Are elderly.  Are female.  Drink alcohol.  Take sedatives or tranquilizers.  Have a family history of sleep apnea. What are the signs or symptoms? Symptoms of this condition include:  Trouble staying asleep.  Daytime sleepiness and tiredness.  Irritability.  Loud snoring.  Morning headaches.  Trouble concentrating.  Forgetfulness.  Decreased interest in sex.  Unexplained sleepiness.  Mood swings.  Personality changes.  Feelings of depression.  Waking  up often during the night to urinate.  Dry mouth.  Sore throat. How is this diagnosed? This condition may be diagnosed with:  A medical history.  A physical exam.  A series of tests that are done while you are sleeping (sleep study). These tests are usually done in a sleep lab, but they may also be done at home. How is this treated? Treatment for this condition aims to restore normal breathing and to ease symptoms during sleep. It may involve managing health issues that can affect breathing, such as high blood pressure or obesity. Treatment may include:  Sleeping on your side.  Using a decongestant if you have nasal congestion.  Avoiding the use of depressants, including alcohol, sedatives, and narcotics.  Losing weight if you are overweight.  Making changes to your diet.  Quitting smoking.  Using a device to open your airway while you sleep, such as:  An oral appliance. This is a custom-made mouthpiece that shifts your lower jaw forward.  A continuous positive airway pressure (CPAP) device. This device delivers oxygen to your airway through a mask.  A nasal expiratory positive airway pressure (EPAP) device. This device has valves that you put into each nostril.  A bi-level positive airway pressure (BPAP) device. This device delivers oxygen to your airway through a mask.  Surgery if other treatments do not work. During surgery, excess tissue is removed to create a wider airway. It is important to get treatment for sleep apnea. Without treatment, this condition can lead to:  High blood pressure.  Coronary artery disease.  (Men) An inability to achieve or maintain an  erection (impotence).  Reduced thinking abilities. Follow these instructions at home:  Make any lifestyle changes that your health care provider recommends.  Eat a healthy, well-balanced diet.  Take over-the-counter and prescription medicines only as told by your health care provider.  Avoid using  depressants, including alcohol, sedatives, and narcotics.  Take steps to lose weight if you are overweight.  If you were given a device to open your airway while you sleep, use it only as told by your health care provider.  Do not use any tobacco products, such as cigarettes, chewing tobacco, and e-cigarettes. If you need help quitting, ask your health care provider.  Keep all follow-up visits as told by your health care provider. This is important. Contact a health care provider if:  The device that you received to open your airway during sleep is uncomfortable or does not seem to be working.  Your symptoms do not improve.  Your symptoms get worse. Get help right away if:  You develop chest pain.  You develop shortness of breath.  You develop discomfort in your back, arms, or stomach.  You have trouble speaking.  You have weakness on one side of your body.  You have drooping in your face. These symptoms may represent a serious problem that is an emergency. Do not wait to see if the symptoms will go away. Get medical help right away. Call your local emergency services (911 in the [place] Do not drive yourself to the hospital.  This information is not intended to replace advice given to you by your health care provider. Make sure you discuss any questions you have with your health care provider. Document Released: 02/06/2002 Document Revised: 10/13/2015 Document Reviewed: 11/26/2014 Elsevier Interactive Patient Education  2017 [employer].

## 2016-03-24 ENCOUNTER — Telehealth: Payer: Self-pay

## 2016-03-24 ENCOUNTER — Other Ambulatory Visit: Payer: Self-pay | Admitting: Obstetrics and Gynecology

## 2016-03-24 LAB — VITAMIN D 25 HYDROXY (VIT D DEFICIENCY, FRACTURES): VIT D 25 HYDROXY: 13 ng/mL — AB (ref 30–100)

## 2016-03-24 MED ORDER — VITAMIN D (ERGOCALCIFEROL) 1.25 MG (50000 UNIT) PO CAPS: 50000.0000 [IU] | ORAL_CAPSULE | ORAL | 0 refills | Status: DC

## 2016-03-24 MED ORDER — POTASSIUM CHLORIDE CRYS ER 20 MEQ PO TBCR: 40.0000 meq | EXTENDED_RELEASE_TABLET | Freq: Once | ORAL | 0 refills | Status: DC

## 2016-03-24 NOTE — Telephone Encounter (Signed)
-----   Message from Katheren Shams, DO sent at 03/24/2016  2:56 PM EST ----- [facility] team, I tried calling patient but no answer and no voice mail. Please try calling pt again and let her know that her labs showed a low vitamin D. She is deficient. Optimal levels >30. Sent in medication to her pharmacy to take. She takes one pill every 7 days (see prescription).   Thanks! Katheren Shams, DO

## 2016-03-24 NOTE — Telephone Encounter (Signed)
Tried calling pt- no option for VM, will try again later.

## 2016-03-27 ENCOUNTER — Telehealth: Payer: Self-pay | Admitting: *Deleted

## 2016-03-27 ENCOUNTER — Ambulatory Visit (INDEPENDENT_AMBULATORY_CARE_PROVIDER_SITE_OTHER): Payer: BLUE CROSS/BLUE SHIELD | Admitting: Physician Assistant

## 2016-03-27 ENCOUNTER — Encounter: Payer: Self-pay | Admitting: Physician Assistant

## 2016-03-27 VITALS — BP 115/82 | HR 85 | Ht 62.0 in | Wt 206.2 lb

## 2016-03-27 DIAGNOSIS — R079 Chest pain, unspecified: Secondary | ICD-10-CM

## 2016-03-27 DIAGNOSIS — R55 Syncope and collapse: Secondary | ICD-10-CM | POA: Diagnosis not present

## 2016-03-27 NOTE — Progress Notes (Signed)
Cardiology Office Note   Date:  03/27/2016   ID:  Laurie Choi, DOB 1980/04/03, MRN [id number]  PCP:  Luiz Blare, DO  Cardiologist:  New, Dr Nolon Stalls, PA-C   Chief Complaint  Patient presents with  . New Evaluation    dizzy, Bp issues    History of Present Illness: Laurie Choi is a 36 y.o. female with a history of hyperemesis during pregnancy  Admit 10/19-10/21 for ?TIA vs complex migraine, dx HTN, sinusitis. Pt given HCTZ for 30 days. TSH was normal 01/18, ER visit for CP, trop neg x 2, pt dc home 01/22 [facility] office visit, pt w/ hx syncope (while walking w/ urge to have BM or vomit), BP low afterwards per reports, cards referral requested for possible monitor  Laurie Choi presents for cardiology evaluation.  Prior to the 01/18 ER visit, pt had been feeling terrible. She was tired, she felt chest pressure that made it hard to breathe. She felt sluggish, was diaphoretic. No N&V, temp 100.1. Trop neg x 2 and pt d/c home.  Prior to syncopal episode 01/19, she had been nauseated and felt anxious and was tired. Her PO intake had been poor that day. She went to the bathroom feeling the urge to vomit and have a BM. She felt about to vomit and woke up on the floor. She had some visual field loss, but could hear her husband doing things for her. She vomited profusely and felt much better. She was able to get cleaned up and go to bed. She felt much better the next day.    She does not get CP with exertion. She admits to eating poorly and having a low activity level. However, she does housework and plays with/lifts her 17 & 59 year old daughters without getting symptoms.  The first episode was 12/02/2015, the anniversary of her mother's death. She had not eaten. She was trying to exercise, she developed nausea, the urge to have a BM, and dizziness. She almost passed out, but did not. She did not seek help. Ate and hydrated herself and felt much  better.  The episode 12/19/2015 was felt to be TIA vs complex migraines, but started exactly like the 10/02 episode.  Since the last episode on 01/19, she has made sure to keep herself hydrated, has been compliant with the HCTZ and has been able to exercise without symptoms. No hx LE edema, orthopnea or PND. She has not been watching her sodium or doing daily weights.   Someone at PCP office told her she had stage 2 CHF, she has been worried sick about this. She has been in fear of her life. This feeling was made worse by her mother dying of lupus at 32. She does not have lupus.   Past Medical History:  Diagnosis Date  . Benign essential HTN   . Hyperemesis gravidarum before end of [redacted] week gestation, dehydration     Past Surgical History:  Procedure Laterality Date  . PICC LINE PLACE PERIPHERAL (Show Low HX)  2011   for rx and hydration 2nd hyperemesis    Current Outpatient Prescriptions  Medication Sig Dispense Refill  . hydrochlorothiazide (HYDRODIURIL) 25 MG tablet Take 1 tablet (25 mg total) by mouth daily. 30 tablet 0   No current facility-administered medications for this visit.     Allergies:   Tape    Social History:  The patient  reports that she has never smoked. She has never used  smokeless tobacco. She reports that she does not drink alcohol or use drugs.   Family History:  The patient's family history includes Diabetes in her father; Hypertension in her father; Lupus (age of onset: 53) in her mother.    ROS:  Please see the history of present illness. All other systems are reviewed and negative.    PHYSICAL EXAM: VS:  BP 115/82 (BP Location: Left Arm)   Pulse 85   Ht 5\' 2"  (1.575 m)   Wt 206 lb 3.2 oz (93.5 kg)   LMP 03/19/2016 (Exact Date)   BMI 37.71 kg/m  , BMI Body mass index is 37.71 kg/m. GEN: Well nourished, well developed, female in no acute distress  HEENT: normal for age  Neck: no JVD, no carotid bruit, no masses Cardiac: RRR; Soft murmur, no  rubs, or gallops Respiratory:  clear to auscultation bilaterally, normal work of breathing GI: soft, nontender, nondistended, + BS MS: no deformity or atrophy; no edema; distal pulses are 2+ in all 4 extremities   Skin: warm and dry, no rash Neuro:  Strength and sensation are intact Psych: euthymic mood, full affect   EKG:  EKG is ordered today. The ekg ordered today demonstrates sinus rhythm, diffuse T-wave flattening similar to 03/19/2016 ECG, rate is not as fast.  ECHO: 12/21/2015 - Left ventricle: The cavity size was normal. Wall thickness was   increased in a pattern of moderate LVH. Systolic function was   normal. The estimated ejection fraction was in the range of 55%   to 60%. Wall motion was normal; there were no regional wall   motion abnormalities. Features are consistent with a pseudonormal   left ventricular filling pattern, with concomitant abnormal   relaxation and increased filling pressure (grade 2 diastolic   dysfunction).  CAROTID DOPPLERS: 12/20/2015 - The vertebral arteries appear patent with antegrade flow. - Findings consistent with 1- 39 percent stenosis involving the   right internal carotid artery and the left internal carotid   artery.   Recent Labs: 12/19/2015: ALT 13; TSH 1.253 03/19/2016: B Natriuretic Peptide 19.5; BUN 10; Creatinine, Ser 0.67; Hemoglobin 13.8; Platelets 290; Potassium 3.4; Sodium 133    Lipid Panel    Component Value Date/Time   CHOL 200 12/20/2015 1108   TRIG 62 12/20/2015 1108   HDL 51 12/20/2015 1108   CHOLHDL 3.9 12/20/2015 1108   VLDL 12 12/20/2015 1108   LDLCALC 137 (H) 12/20/2015 1108     Wt Readings from Last 3 Encounters:  03/27/16 206 lb 3.2 oz (93.5 kg)  03/23/16 208 lb 12.8 oz (94.7 kg)  03/19/16 200 lb (90.7 kg)     Other studies Reviewed: Additional studies/ records that were reviewed today include: Office notes, hospital records and testing.  ASSESSMENT AND PLAN: The patient and the plan were reviewed  with Dr. Ellyn Hack, who concurs  1.  Chest pain: She has only had the one episode. She has no history of exertional symptoms. Her cardiac enzymes and ECG were normal. A recent echocardiogram was normal as well. Her LDL is elevated, and she admits to poor dietary habits and no exercise. We'll check a coronary calcium score in follow-up on the results. If that is normal, no further workup is indicated.  2. Presyncope/syncope: Her symptoms have all happened in the setting of a prodrome of the urge to move her bowels plus extreme nausea followed by dizziness, then near syncope or syncope. She has not had palpitations. She had no cardiac arrhythmia when she was  on telemetry during her hospitalization back in October. She was awake but with a low blood pressure after the incident on January 19th.  The symptoms are consistent with a vasovagal episode. She did not have elevated right heart pressures or diastolic dysfunction on her echocardiogram. There was no HOCM. Therefore, I do not believe that additional cardiology evaluation is indicated at this time. She understands the need to keep herself hydrated and watch for symptoms. She understands that if she begins to feel the symptoms of dizziness or the urge to move her bowels, she needs to act on those at once in weight. If she has additional symptoms, further testing can be done at that time.  3. Class II CHF: I explained to the patient what that meant. She has grade 2 diastolic dysfunction on her echocardiogram, but her EF is normal in pulmonary pressures were not elevated. She needs to be very vigilant about keeping her blood pressure under control. She needs to increase her activity and improve her fitness. She is not having any signs or symptoms of CHF by echocardiogram or by exam. She had normal right heart pressures. She is on HCTZ and should continue this.  4. Hypokalemia: B med for her ER visit on 01/18, her potassium was slightly low at 3.4. I advised  that all she needed to do was heat some food with potassium in it and that would probably be enough. If she starts getting leg cramps or other symptoms of hypokalemia, she is encouraged to follow-up with her PCP.   Current medicines are reviewed at length with the patient today.  The patient has concerns regarding medicines. Concerns were addressed  The following changes have been made:  no change  Labs/ tests ordered today include:   Orders Placed This Encounter  Procedures  . CT CARDIAC SCORING  . EKG 12-Lead     Disposition:   FU with Dr. Ellyn Hack in 1 year or as needed  Signed, Lenoard Aden  03/27/2016 12:46 PM    [place] Phone: [phone]; Fax: [phone]  This note was written with the assistance of speech recognition software. Please excuse any transcriptional errors.

## 2016-03-27 NOTE — Patient Instructions (Signed)
Medication Instructions:  Your physician recommends that you continue on your current medications as directed. Please refer to the Current Medication list given to you today.  Labwork: NONE  Testing/Procedures: Your physician has requested that you have cardiac CT. Cardiac computed tomography (CT) is a painless test that uses an x-ray machine to take clear, detailed pictures of your heart. For further information please visit [email or web address]. Please follow instruction sheet as given.    Follow-Up: Your physician wants you to follow-up in: [place] [address] [facility]. You will receive a reminder letter in the mail two months in advance. If you don't receive a letter, please call our office to schedule the follow-up appointment.     If you need a refill on your cardiac medications before your next appointment, please call your pharmacy.

## 2016-03-27 NOTE — Telephone Encounter (Signed)
Patient driver license found on floor after OV this AM, attempt to call patient to make aware.  Invalid phone #.    Will place in envelope and place at front desk.

## 2016-04-03 DIAGNOSIS — Z124 Encounter for screening for malignant neoplasm of cervix: Secondary | ICD-10-CM | POA: Diagnosis not present

## 2016-04-03 DIAGNOSIS — N943 Premenstrual tension syndrome: Secondary | ICD-10-CM | POA: Diagnosis not present

## 2016-04-03 DIAGNOSIS — Z01419 Encounter for gynecological examination (general) (routine) without abnormal findings: Secondary | ICD-10-CM | POA: Diagnosis not present

## 2016-04-03 DIAGNOSIS — Z3040 Encounter for surveillance of contraceptives, unspecified: Secondary | ICD-10-CM | POA: Diagnosis not present

## 2016-04-06 ENCOUNTER — Telehealth: Payer: Self-pay | Admitting: Physician Assistant

## 2016-04-06 NOTE — Telephone Encounter (Signed)
Tried to call the patient to scheduled cardiac CT, but, her number is a non working number.  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

## 2016-04-07 ENCOUNTER — Telehealth: Payer: Self-pay | Admitting: Cardiology

## 2016-04-07 NOTE — Telephone Encounter (Signed)
Tried to call patients number, but, the number is not a working number.

## 2016-04-09 ENCOUNTER — Telehealth: Payer: Self-pay | Admitting: Obstetrics and Gynecology

## 2016-04-09 NOTE — Telephone Encounter (Signed)
Unsure how to proceed. Called the number given but was placed on hold for >30 min. If someone calls back please get a direct extension to someone I can talk to. I do not have time in my clinic schedule to wait this long on the line.

## 2016-04-09 NOTE — Telephone Encounter (Signed)
Sleep center: insurance company is requesting a peer to peer review. [phone]-please call within 24 hrs.

## 2016-04-15 NOTE — Telephone Encounter (Signed)
Spoke with Terri at the sleep center and this was the only number provided to her when she tried to call and get the authorization.  She states that she will be calling the insurance again tomorrow to try again and will update me on what needs to be done on our end.  Frimet Durfee,CMA

## 2016-04-16 ENCOUNTER — Telehealth: Payer: Self-pay | Admitting: Family Medicine

## 2016-04-16 NOTE — Telephone Encounter (Signed)
Sleep center:  Insurance will pay for a in home study but not one in the lab. New orders would be needed for in-home study if the dr wants to pursue the sleep study

## 2016-04-21 NOTE — Telephone Encounter (Signed)
I believe a sleep study is appropriate for patient. I will leave to PCP to order so all results and follow-up will go to her since I only saw this patient once in [place]. In home sleep study would be appropriate to see if she has diagnosis of OSA as was suggested in her complaints. [place] can you please order! Thanks all

## 2016-04-29 NOTE — Telephone Encounter (Signed)
Sleep study ordered 03/23/2016.

## 2016-08-18 ENCOUNTER — Ambulatory Visit (HOSPITAL_COMMUNITY)
Admission: RE | Admit: 2016-08-18 | Discharge: 2016-08-18 | Disposition: A | Discharge status: Home / Self Care | Payer: BLUE CROSS/BLUE SHIELD | Source: Ambulatory Visit | Attending: Family Medicine | Admitting: Family Medicine

## 2016-08-18 ENCOUNTER — Ambulatory Visit (INDEPENDENT_AMBULATORY_CARE_PROVIDER_SITE_OTHER): Payer: BLUE CROSS/BLUE SHIELD | Admitting: Family Medicine

## 2016-08-18 ENCOUNTER — Encounter: Payer: Self-pay | Admitting: Family Medicine

## 2016-08-18 VITALS — BP 150/102 | HR 94 | Temp 98.6°F | Ht 62.0 in | Wt 213.0 lb

## 2016-08-18 DIAGNOSIS — I10 Essential (primary) hypertension: Secondary | ICD-10-CM | POA: Insufficient documentation

## 2016-08-18 DIAGNOSIS — F411 Generalized anxiety disorder: Secondary | ICD-10-CM | POA: Insufficient documentation

## 2016-08-18 DIAGNOSIS — R0789 Other chest pain: Secondary | ICD-10-CM

## 2016-08-18 MED ORDER — LOSARTAN POTASSIUM 50 MG PO TABS: 50.0000 mg | ORAL_TABLET | Freq: Every day | ORAL | 3 refills | Status: DC

## 2016-08-18 MED ORDER — HYDROXYZINE HCL 50 MG PO TABS: 50.0000 mg | ORAL_TABLET | Freq: Three times a day (TID) | ORAL | 1 refills | Status: DC | PRN

## 2016-08-18 NOTE — Assessment & Plan Note (Addendum)
Patient's BP initially elevated to 180s/110s. This improved to 150/102 spontaneously while in the office. Patient already has evidence of end-organ damage on her echo from last year (LVH, grade 2 diastolic dysfunction). EKG today with no ischemic findings. She also has a significant underlying psych component (see note from [facility] for today)  Plan: - Start losartan 50mg  daily given her LVH and diastolic dysfunction - Check for secondary causes of hypertension: cortisol, BMET, TSH, aldosterone:renin ratio. - Follow up in 1-2 weeks for repeat BP and repeat BMET - Consider renal ultrasound pending above management.  - Will follow up with [facility].

## 2016-08-18 NOTE — Patient Instructions (Signed)
Start the losartan.  Take the hydroxyzine as needed.  Come back in 1-2 weeks to recheck blood pressure and blood work.  Take care,  Dr Jerline Pain

## 2016-08-18 NOTE — Progress Notes (Signed)
Dr. Jerline Pain requested a [place].   Presenting Issue:  Stress  Issues Discussed:  Patient has been feeling stressed and reports low mood. She also feels guilty about feeling this way because she feels that she has a good life. Her daughters are out of school and she has been enjoying spending time with them, but feels drained at the end of the day and doesn't talk to her husband as much as she'd like to when he returns from work. She is also stressed about her blood pressure and overall health. Patient continues to feel down about her weight and would like to be more physically active.   Assessment / Plan / Recommendations: Patient met with me during a warm handoff in October 2017 and wanted to follow up with [facility] at that time but didn't make an appointment. She feels ready to make a [facility] appointment now and wants to work towards goals of coping with stress and increasing physical activity. Today we discussed being kind to herself and giving herself permission to feel stressed or down without feeling guilty on top of those feelings. Also led patient through deep breathing exercise. Patient reported that she used to do yoga and likes deep breathing relaxation. I asked her to practice this 1-2 times per day and as needed when feeling overwhelmed. She will meet with Neoma Laming next week in integrated care clinic.   Warmhandoff:    Warm Hand Off Completed.

## 2016-08-18 NOTE — Assessment & Plan Note (Signed)
Contributing to her elevated BP. See full note from [facility] for further details. She will be following up with them within the next few weeks. Will start prn hydroxyzine for now. Consider initiation of SSRI +/- buspar depending on her response to counseling and hydroxyzine.

## 2016-08-18 NOTE — Progress Notes (Signed)
    Subjective:  Laurie Choi is a 36 y.o. female who presents to the [facility] today with a chief complaint of elevated blood pressure.   HPI:  Elevated Blood Pressure Patient was previously prescribed HCTZ for blood pressure earlier this year. She took it for about a month then stopped. She has noticed her pressures have been running in the 180s/110s. She reports that she has been feeling overwhelmed at home as the reason for not taking her medications. She has been having occasional chest tightness. No shortness of breath. No blurred vision.   ROS: Per HPI  PMH: Smoking history reviewed.   Objective:  Physical Exam: BP (!) 150/102   Pulse 94   Temp 98.6 F (37 C) (Oral)   Ht 5\' 2"  (1.575 m)   Wt 213 lb (96.6 kg)   LMP 08/09/2016   SpO2 98%   BMI 38.96 kg/m   Gen: NAD, resting comfortably CV: RRR with no murmurs appreciated Pulm: NWOB, CTAB with no crackles, wheezes, or rhonchi GI: Morbidly obese, Normal bowel sounds present. Soft, Nontender, Nondistended. Skin: warm, dry Neuro: grossly normal, moves all extremities Psych: Normal affect and thought content  EKG: NSR, no acute ischemic changes.   Assessment/Plan:  Accelerated hypertension Patient's BP initially elevated to 180s/110s. This improved to 150/102 spontaneously while in the office. Patient already has evidence of end-organ damage on her echo from last year (LVH, grade 2 diastolic dysfunction). EKG today with no ischemic findings. She also has a significant underlying psych component (see note from [facility] for today)  Plan: - Start losartan 50mg  daily given her LVH and diastolic dysfunction - Check for secondary causes of hypertension: cortisol, BMET, TSH, aldosterone:renin ratio. - Follow up in 1-2 weeks for repeat BP and repeat BMET - Consider renal ultrasound pending above management.  - Will follow up with [facility]   Anxiety state Contributing to her elevated BP. See full note from [facility] for further details. She  will be following up with them within the next few weeks. Will start prn hydroxyzine for now. Consider initiation of SSRI +/- buspar depending on her response to counseling and hydroxyzine.   Algis Greenhouse. Jerline Pain, [place] Resident PGY-3 08/18/2016 10:50 AM

## 2016-08-21 LAB — BASIC METABOLIC PANEL
BUN / CREAT RATIO: 16 (ref 9–23)
BUN: 12 mg/dL (ref 6–20)
CALCIUM: 9.5 mg/dL (ref 8.7–10.2)
CHLORIDE: 103 mmol/L (ref 96–106)
CO2: 25 mmol/L (ref 20–29)
Creatinine, Ser: 0.75 mg/dL (ref 0.57–1.00)
GFR calc Af Amer: 118 mL/min/{1.73_m2} (ref >59)
GFR calc non Af Amer: 102 mL/min/{1.73_m2} (ref >59)
GLUCOSE: 104 mg/dL — AB (ref 65–99)
POTASSIUM: 3.7 mmol/L (ref 3.5–5.2)
Sodium: 140 mmol/L (ref 134–144)

## 2016-08-21 LAB — ALDOSTERONE + RENIN ACTIVITY W/ RATIO
ALDOS/RENIN RATIO: 46 — ABNORMAL HIGH (ref 0.0–30.0)
ALDOSTERONE: 10.9 ng/dL (ref 0.0–30.0)
Renin: 0.237 ng/mL/hr (ref 0.167–5.380)

## 2016-08-21 LAB — CBC
Hematocrit: 39.5 % (ref 34.0–46.6)
Hemoglobin: 13 g/dL (ref 11.1–15.9)
MCH: 28.7 pg (ref 26.6–33.0)
MCHC: 32.9 g/dL (ref 31.5–35.7)
MCV: 87 fL (ref 79–97)
PLATELETS: 338 10*3/uL (ref 150–379)
RBC: 4.53 x10E6/uL (ref 3.77–5.28)
RDW: 14.4 % (ref 12.3–15.4)
WBC: 6 10*3/uL (ref 3.4–10.8)

## 2016-08-21 LAB — TSH: TSH: 1.65 u[IU]/mL (ref 0.450–4.500)

## 2016-08-21 LAB — CORTISOL: Cortisol: 4.1 ug/dL

## 2016-08-22 ENCOUNTER — Emergency Department (HOSPITAL_COMMUNITY)
Admission: EM | Admit: 2016-08-22 | Discharge: 2016-08-22 | Disposition: A | Discharge status: Home / Self Care | Payer: BLUE CROSS/BLUE SHIELD | Attending: Emergency Medicine | Admitting: Emergency Medicine

## 2016-08-22 ENCOUNTER — Emergency Department (HOSPITAL_COMMUNITY): Payer: BLUE CROSS/BLUE SHIELD

## 2016-08-22 ENCOUNTER — Encounter (HOSPITAL_COMMUNITY): Payer: Self-pay

## 2016-08-22 ENCOUNTER — Other Ambulatory Visit: Payer: Self-pay

## 2016-08-22 DIAGNOSIS — Z79899 Other long term (current) drug therapy: Secondary | ICD-10-CM | POA: Diagnosis not present

## 2016-08-22 DIAGNOSIS — I10 Essential (primary) hypertension: Secondary | ICD-10-CM | POA: Diagnosis not present

## 2016-08-22 DIAGNOSIS — R079 Chest pain, unspecified: Secondary | ICD-10-CM | POA: Diagnosis not present

## 2016-08-22 LAB — BASIC METABOLIC PANEL
ANION GAP: 9 (ref 5–15)
BUN: 11 mg/dL (ref 6–20)
CO2: 23 mmol/L (ref 22–32)
Calcium: 9.3 mg/dL (ref 8.9–10.3)
Chloride: 105 mmol/L (ref 101–111)
Creatinine, Ser: 0.79 mg/dL (ref 0.44–1.00)
GFR calc Af Amer: >60 mL/min (ref >60)
GFR calc non Af Amer: >60 mL/min (ref >60)
Glucose, Bld: 110 mg/dL — ABNORMAL HIGH (ref 65–99)
POTASSIUM: 3.4 mmol/L — AB (ref 3.5–5.1)
SODIUM: 137 mmol/L (ref 135–145)

## 2016-08-22 LAB — CBC
HEMATOCRIT: 39.3 % (ref 36.0–46.0)
Hemoglobin: 12.9 g/dL (ref 12.0–15.0)
MCH: 28.5 pg (ref 26.0–34.0)
MCHC: 32.8 g/dL (ref 30.0–36.0)
MCV: 86.9 fL (ref 78.0–100.0)
Platelets: 312 10*3/uL (ref 150–400)
RBC: 4.52 MIL/uL (ref 3.87–5.11)
RDW: 14.1 % (ref 11.5–15.5)
WBC: 8.1 10*3/uL (ref 4.0–10.5)

## 2016-08-22 LAB — I-STAT TROPONIN, ED: Troponin i, poc: 0 ng/mL (ref 0.00–0.08)

## 2016-08-22 MED ORDER — SODIUM CHLORIDE 0.9 % IV SOLN: INTRAVENOUS | Status: DC

## 2016-08-22 MED ORDER — LABETALOL HCL 5 MG/ML IV SOLN: 20.0000 mg | INTRAVENOUS | Status: DC | PRN

## 2016-08-22 NOTE — ED Notes (Signed)
Pt refused ct scan

## 2016-08-22 NOTE — ED Triage Notes (Signed)
Pt complaining of hypertension and chest pressure x 3 days. Pt also complaining of headache and blurred vision x 2 days. Pt states recently changed htn meds.

## 2016-08-22 NOTE — ED Provider Notes (Signed)
[facility] DEPT Provider Note   CSN: [id number] Arrival date & time: 08/22/16  0122     History   Chief Complaint Chief Complaint  Patient presents with  . Hypertension  . Chest Pain    HPI Laurie Choi is a 36 y.o. female.  She presents for evaluation of high blood pressure.  She is concerned about persistent blood pressure elevation, despite being on a new medicine, losartan, for 4 days.  In the past she was on hydrochlorothiazide with good results.  She had been off this medication for a while, because of lack of follow-up.  Patient denies blurred vision, persistent headache, neck pain, back pain, abdominal pain, dizziness or paresthesia.  She has a vague soreness in her anterior chest and occasional headache.  She feels like she is under stress.  There are no other known modifying factors.  HPI  Past Medical History:  Diagnosis Date  . Benign essential HTN   . Hyperemesis gravidarum before end of [redacted] week gestation, dehydration     Patient Active Problem List   Diagnosis Date Noted  . Accelerated hypertension 08/18/2016  . Anxiety state 08/18/2016  . Heart failure with preserved ejection fraction ([place]) 12/24/2015  . Bilateral hand pain 12/24/2015  . Depression 12/24/2015  . Atypical migraine 12/20/2015  . Severe obesity (BMI >= 40) (HCC)   . Elevated blood pressure reading     Past Surgical History:  Procedure Laterality Date  . PICC LINE PLACE PERIPHERAL ([place] HX)  2011   for rx and hydration 2nd hyperemesis    OB History    Gravida Para Term Preterm AB Living   2 2 1     2    SAB TAB Ectopic Multiple Live Births           1       Home Medications    Prior to Admission medications   Medication Sig Start Date End Date Taking? Authorizing Provider  hydrOXYzine (ATARAX/VISTARIL) 50 MG tablet Take 1 tablet (50 mg total) by mouth 3 (three) times daily as needed. Patient taking differently: Take 50 mg by mouth 3 (three) times daily as needed for  anxiety or itching.  08/18/16  Yes Vivi Barrack, MD  losartan (COZAAR) 50 MG tablet Take 1 tablet (50 mg total) by mouth daily. 08/18/16  Yes Vivi Barrack, MD    Family History Family History  Problem Relation Age of Onset  . Lupus Mother 51  . Hypertension Father   . Diabetes Father     Social History Social History  Substance Use Topics  . Smoking status: Never Smoker  . Smokeless tobacco: Never Used  . Alcohol use No     Allergies   Tape   Review of Systems Review of Systems  All other systems reviewed and are negative.    Physical Exam Updated Vital Signs BP (!) 179/104   Pulse 83   Temp 98.6 F (37 C)   Resp 17   LMP 08/09/2016   SpO2 96%   Physical Exam  Constitutional: She is oriented to person, place, and time. She appears well-developed. No distress.  Overweight  HENT:  Head: Normocephalic and atraumatic.  Eyes: Conjunctivae and EOM are normal. Pupils are equal, round, and reactive to light.  Neck: Normal range of motion and phonation normal. Neck supple.  Cardiovascular: Normal rate and regular rhythm.   Pulmonary/Chest: Effort normal and breath sounds normal. She exhibits no tenderness.  Abdominal: Soft. She exhibits no distension. There  is no tenderness. There is no guarding.  Musculoskeletal: Normal range of motion.  Neurological: She is alert and oriented to person, place, and time. She exhibits normal muscle tone.  No dysarthria, aphasia, or nystagmus.  Skin: Skin is warm and dry.  Psychiatric: She has a normal mood and affect. Her behavior is normal. Judgment and thought content normal.  Nursing note and vitals reviewed.    ED Treatments / Results  Labs (all labs ordered are listed, but only abnormal results are displayed) Labs Reviewed  BASIC METABOLIC PANEL - Abnormal; Notable for the following:       Result Value   Potassium 3.4 (*)    Glucose, Bld 110 (*)    All other components within normal limits  CBC  I-STAT TROPOININ,  ED    EKG  EKG Interpretation  Date/Time:  Saturday August 22 2016 08:18:51 EDT Ventricular Rate:  83 PR Interval:  174 QRS Duration: 87 QT Interval:  396 QTC Calculation: 466 R Axis:   33 Text Interpretation:  Sinus rhythm Consider left ventricular hypertrophy since last tracing no significant change Confirmed by Daleen Bo [phone]) on 08/22/2016 8:59:58 AM       Radiology Dg Chest 2 View  Result Date: 08/22/2016 CLINICAL DATA:  Chest pain radiating to back for 1 week. EXAM: CHEST  2 VIEW COMPARISON:  Chest radiograph March 19, 2016 FINDINGS: Cardiomediastinal silhouette is normal. No pleural effusions or focal consolidations. Trachea projects midline and there is no pneumothorax. Soft tissue planes and included osseous structures are non-suspicious. IMPRESSION: Normal chest. Electronically Signed   By: Elon Alas M.D.   On: 08/22/2016 02:16    Procedures Procedures (including critical care time)  Medications Ordered in ED Medications - No data to display   Initial Impression / Assessment and Plan / ED Course  I have reviewed the triage vital signs and the nursing notes.  Pertinent labs & imaging results that were available during my care of the patient were reviewed by me and considered in my medical decision making (see chart for details).      Patient Vitals for the past 24 hrs:  BP Temp Pulse Resp SpO2  08/22/16 0830 (!) 179/104 - 83 17 96 %  08/22/16 0817 (!) 169/112 - 74 13 98 %  08/22/16 0816 (!) 169/112 - - - -  08/22/16 0745 (!) 183/121 - 73 - 99 %  08/22/16 0730 (!) 182/121 - 73 - 98 %  08/22/16 0137 (!) 176/113 98.6 F (37 C) 84 16 100 %     08:54Reevaluation with update and discussion. After initial assessment and treatment, an updated evaluation reveals at this time the patient is tearful, very upset and states that this provider does not care about her life.  About complications from high blood pressure.  Therefore additional treatment will be  initiated, and will extend evaluation to include CT scan to look for visible complications of high blood pressure such as intracranial bleeding.  Also, will lower blood pressure, with intravenous antihypertensive medication.  I suspect that the patient will require hospitalization for further evaluation and treatment. Mariea Mcmartin L   09: 5 0-at this time the patient has decided that she did not want to have additional testing and treatment done.  He is now calm and states that she understands that there are no emergencies requiring intervention, at this time.   Final Clinical Impressions(s) / ED Diagnoses   Final diagnoses:  Hypertension, unspecified type    High blood pressure, on  losartan, for 4 days, without movement yet.  Patient's symptoms are vague, and not clearly indicative of hypertensive urgency.  Patient is very concerned and worried about her status therefore, additional treatment was begun.  Nursing Notes Reviewed/ Care Coordinated Applicable Imaging Reviewed Interpretation of Laboratory Data incorporated into ED treatment  The patient appears reasonably screened and/or stabilized for discharge and I doubt any other medical condition or other [facility] requiring further screening, evaluation, or treatment in the ED at this time prior to discharge.  Plan: Home Medications-continue usual medication; Home Treatments-low-salt diet, rest, regular foods and mild exercise; return here if the recommended treatment, does not improve the symptoms; Recommended follow up-PCP as scheduled and needed.     New Prescriptions New Prescriptions   No medications on file     Daleen Bo, MD 08/22/16 [phone]

## 2016-08-22 NOTE — Discharge Instructions (Signed)
Your blood pressure is moderately elevated, but will likely improve after you have been on the losartan another couple of weeks.  It is important to follow-up with your primary care doctor as planned for further evaluation and treatment.  Return here, if your condition worsens.

## 2016-08-22 NOTE — ED Notes (Signed)
Pt instructed to take her home Losartan due to her B/p

## 2016-08-25 ENCOUNTER — Telehealth: Payer: Self-pay | Admitting: Family Medicine

## 2016-08-25 NOTE — Telephone Encounter (Signed)
Patient has [facility] appointment tomorrow. She needs a follow up visit to recheck her BP and recheck labs.  Algis Greenhouse. Jerline Pain, [place] Resident PGY-3 08/25/2016 12:12 PM

## 2016-08-25 NOTE — Telephone Encounter (Signed)
Patient is aware of this and will plan to see you after her appt with [facility]. Jazmin Hartsell,CMA

## 2016-08-26 ENCOUNTER — Encounter: Payer: Self-pay | Admitting: Family Medicine

## 2016-08-26 ENCOUNTER — Ambulatory Visit (INDEPENDENT_AMBULATORY_CARE_PROVIDER_SITE_OTHER): Payer: BLUE CROSS/BLUE SHIELD | Admitting: Family Medicine

## 2016-08-26 ENCOUNTER — Ambulatory Visit (INDEPENDENT_AMBULATORY_CARE_PROVIDER_SITE_OTHER): Payer: BLUE CROSS/BLUE SHIELD | Admitting: Licensed Clinical Social Worker

## 2016-08-26 VITALS — BP 130/82 | HR 88 | Temp 98.4°F | Ht 62.0 in | Wt 212.0 lb

## 2016-08-26 DIAGNOSIS — I10 Essential (primary) hypertension: Secondary | ICD-10-CM | POA: Diagnosis not present

## 2016-08-26 DIAGNOSIS — R799 Abnormal finding of blood chemistry, unspecified: Secondary | ICD-10-CM | POA: Diagnosis not present

## 2016-08-26 DIAGNOSIS — R7989 Other specified abnormal findings of blood chemistry: Secondary | ICD-10-CM

## 2016-08-26 DIAGNOSIS — F439 Reaction to severe stress, unspecified: Secondary | ICD-10-CM

## 2016-08-26 NOTE — Assessment & Plan Note (Signed)
BP improved today. Will continue losartan at current dose. Check BMET today. Given abnormal aldosterone:renin ratio, will refer to endocrinology. Follow up in 1-2 weeks for repeat BP check.

## 2016-08-26 NOTE — Progress Notes (Signed)
  Total time:30 minutes Type of Service: [place] Interpretor:No.  Interpreter Name and Language:NA  SUBJECTIVE: Laurie Choi is a 36 y.o. female  Patient was referred by Dr. Jerline Pain for:  Stress.  Patient reports the following symptoms/concerns: anxiety, depression, fatigue and loss of interest in favorite activities.feeling down about health, her weight and low self-esteam. Feels like I have lost who I am. Patient denies SI and wants to live for her children. Goal is to focus on her health.   Reason for follow-up: Continue brief intervention to address symptoms associated with stress, depression and anxiety. Reports  impairment in daily functions. Does not have energy and feels tired when husband gets home.    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      ASSESSMENT:Patient currently experiencing depression and anxiety.  Symptoms exacerbated by chronic health conditions and overall stress.  Patient may benefit from, and is in agreement to receive further assessment and brief therapeutic interventions to assist with managing her symptoms .   PLAN: 1. Patient will F/U with LCSW in 2 weeks 2. Behavioral recommendations: relaxed breathing, self-care action plan, behavioral activation list 3. Referral: none at this time  Casimer Lanius, LCSW Licensed Clinical Social Worker [place]   [phone] 9:36 AM

## 2016-08-26 NOTE — Patient Instructions (Signed)
Your BP looks better!  Come back in 1-2 weeks to recheck.  We will send you to endocrinology.  Take care,  Dr Jerline Pain

## 2016-08-26 NOTE — Progress Notes (Signed)
    Subjective:  Laurie Choi is a 36 y.o. female who presents to the [facility] today with a chief complaint of HTN.   HPI:  HTN Patient with history of significantly elevated BP. Was seen in clinic 8 days ago ago with BPs in the 180s/110s and started on losartan 50mg  daily. She is tolerating this well without obvious side effects. She had basic work up for secondary causes of hypertension at that time which showed an abnormal aldosterone:renin ration, but normal cortisol level and normal TSH. She did have to go the ED about 4 days ago due to her blood pressure being high, but it has been much better (292K systolics over the past few days). No shortness of breath or blurred vision. She is still having occasional chest tightness which is improving. No current chest pain.   ROS: Per HPI  PMH: Smoking history reviewed.    Objective:  Physical Exam: BP 130/82   Pulse 88   Temp 98.4 F (36.9 C) (Oral)   Ht 5\' 2"  (1.575 m)   Wt 212 lb (96.2 kg)   LMP 08/09/2016   SpO2 99%   BMI 38.78 kg/m   Gen: NAD, resting comfortably CV: RRR with no murmurs appreciated Pulm: NWOB, CTAB with no crackles, wheezes, or rhonchi MSK: no edema, cyanosis, or clubbing noted Skin: warm, dry Neuro: grossly normal, moves all extremities Psych: Normal affect and thought content  Assessment/Plan:  Accelerated hypertension BP improved today. Will continue losartan at current dose. Check BMET today. Given abnormal aldosterone:renin ratio, will refer to endocrinology. Follow up in 1-2 weeks for repeat BP check.   Algis Greenhouse. Jerline Pain, [facility] [facility] Resident PGY-3 08/26/2016 9:58 AM

## 2016-08-27 LAB — BASIC METABOLIC PANEL
BUN / CREAT RATIO: 19 (ref 9–23)
BUN: 14 mg/dL (ref 6–20)
CALCIUM: 9.5 mg/dL (ref 8.7–10.2)
CHLORIDE: 104 mmol/L (ref 96–106)
CO2: 23 mmol/L (ref 20–29)
Creatinine, Ser: 0.73 mg/dL (ref 0.57–1.00)
GFR calc Af Amer: 122 mL/min/{1.73_m2} (ref >59)
GFR calc non Af Amer: 106 mL/min/{1.73_m2} (ref >59)
GLUCOSE: 90 mg/dL (ref 65–99)
POTASSIUM: 4.1 mmol/L (ref 3.5–5.2)
Sodium: 141 mmol/L (ref 134–144)

## 2016-08-28 ENCOUNTER — Encounter: Payer: Self-pay | Admitting: Family Medicine

## 2016-09-15 ENCOUNTER — Encounter: Payer: Self-pay | Admitting: Endocrinology

## 2016-09-15 ENCOUNTER — Ambulatory Visit (INDEPENDENT_AMBULATORY_CARE_PROVIDER_SITE_OTHER): Payer: BLUE CROSS/BLUE SHIELD | Admitting: Endocrinology

## 2016-09-15 VITALS — BP 142/96 | HR 98 | Ht 62.0 in | Wt 213.8 lb

## 2016-09-15 DIAGNOSIS — E669 Obesity, unspecified: Secondary | ICD-10-CM | POA: Diagnosis not present

## 2016-09-15 DIAGNOSIS — I10 Essential (primary) hypertension: Secondary | ICD-10-CM

## 2016-09-15 DIAGNOSIS — I152 Hypertension secondary to endocrine disorders: Secondary | ICD-10-CM

## 2016-09-15 MED ORDER — LABETALOL HCL 100 MG PO TABS: 100.0000 mg | ORAL_TABLET | Freq: Two times a day (BID) | ORAL | 2 refills | Status: DC

## 2016-09-15 MED ORDER — DOXAZOSIN MESYLATE 4 MG PO TABS: 4.0000 mg | ORAL_TABLET | Freq: Every day | ORAL | 1 refills | Status: DC

## 2016-09-15 NOTE — Patient Instructions (Addendum)
Stop Losartan  Salt tablets 3, 3x daily for 3 days and urine test on day 3  Avoid hi sodium foods

## 2016-09-15 NOTE — Progress Notes (Signed)
Patient ID: Laurie Choi, female   DOB: 1980-05-31, 36 y.o.   MRN: [id number]            Referring physician: Jerline Pain  Chief complaint: High blood pressure  History of Present Illness:   The patient was found to have hypertension when she was admitted to the hospital in 12/2015 for either a complicated migraine or TIA episode Apparently at that time she was told to take HCTZ 12.5 mg daily only which she took for a couple months and then did not continue her prescription or follow-up.  She does not think she was previously hypertensive even during her pregnancy  She was also found to have hypokalemia with potassium 3.4 at that time, previous records also indicate her potassium to be 3.4 in 04/2014 She has not been supplemented with potassium Does not complain of muscle cramps  Patient followed up with her PCP on 08/18/16 since she was complaining of some chest tightness and was found to have significantly high blood pressure of 150/102 She was started with losartan 50 mg daily and also had a lab done that morning around 10 AM for aldosterone and renin Aldosterone level was 10.9 with the ratio 46  The patient does check her blood pressure with the [employer] monitor at home and she thinks her blood pressure has been fluctuating between 500-938 systolic and 18-2 17 diastolic even with continuing her losartan She says she is usually trying to cook fresh foods and avoid frozen or canned foods but periodically will eat out at [employer] or other restaurant without being aware of the sodium content  She is now referred here for further management   BP Readings from Last 3 Encounters:  09/15/16 (!) 142/96  08/26/16 130/82  08/22/16 (!) 156/102     Past Medical History:  Diagnosis Date  . Benign essential HTN   . Hyperemesis gravidarum before end of [redacted] week gestation, dehydration     Past Surgical History:  Procedure Laterality Date  . PICC LINE PLACE PERIPHERAL ([place]  HX)  2011   for rx and hydration 2nd hyperemesis    Family History  Problem Relation Age of Onset  . Lupus Mother 32  . Hypertension Father   . Diabetes Father     Social History:  reports that she has never smoked. She has never used smokeless tobacco. She reports that she does not drink alcohol or use drugs.  Allergies:  Allergies  Allergen Reactions  . Tape Other (See Comments)    Medical redness, rash itching     Allergies as of 09/15/2016      Reactions   Tape Other (See Comments)   Medical redness, rash itching      Medication List       Accurate as of 09/15/16 11:10 AM. Always use your most recent med list.          hydrOXYzine 50 MG tablet Commonly known as:  ATARAX/VISTARIL Take 1 tablet (50 mg total) by mouth 3 (three) times daily as needed.   losartan 50 MG tablet Commonly known as:  COZAAR Take 1 tablet (50 mg total) by mouth daily.       LABS:  No visits with results within 1 Week(s) from this visit.  Latest known visit with results is:  Office Visit on 08/26/2016  Component Date Value Ref Range Status  . Glucose 08/26/2016 90  65 - 99 mg/dL Final  . BUN 08/26/2016 14  6 - 20 mg/dL Final  .  Creatinine, Ser 08/26/2016 0.73  0.57 - 1.00 mg/dL Final  . GFR calc non Af Amer 08/26/2016 106  >59 mL/min/1.73 Final  . GFR calc Af Amer 08/26/2016 122  >59 mL/min/1.73 Final  . BUN/Creatinine Ratio 08/26/2016 19  9 - 23 Final  . Sodium 08/26/2016 141  134 - 144 mmol/L Final  . Potassium 08/26/2016 4.1  3.5 - 5.2 mmol/L Final  . Chloride 08/26/2016 104  96 - 106 mmol/L Final  . CO2 08/26/2016 23  20 - 29 mmol/L Final                 **Please note reference interval change**  . Calcium 08/26/2016 9.5  8.7 - 10.2 mg/dL Final        Review of Systems  Constitutional: Positive for weight gain.  HENT: Negative for headaches.   Eyes: Negative for blurred vision.  Respiratory: Negative for shortness of breath.   Cardiovascular: Negative for  palpitations and leg swelling.  Gastrointestinal: Negative for abdominal pain.  Endocrine: Negative for menstrual changes, general weakness and polydipsia.       No history of flushing or excessive sweating episodes  Skin: Negative for abnormal pigmentation.  Allergic/Immunologic: Positive for hives.       Chronic  Neurological: Negative for numbness.     PHYSICAL EXAM:  BP (!) 142/96   Pulse 98   Ht 5\' 2"  (1.575 m)   Wt 213 lb 12.8 oz (97 kg)   SpO2 98%   BMI 39.10 kg/m   GENERAL:  She has marked generalized obesity No cushingoid features of central obesity   No pallor, clubbing, lymphadenopathy or edema.    Skin:  no rash or pigmentation.  EYES:  Externally normal.  Fundii:  normal discs, mild arterial narrowing seen  ENT: Oral mucosa and tongue normal.  THYROID:  Not palpable.  HEART:  Normal  S1 and S2; no murmur or click.  CHEST:  Normal shape.  Lungs: Vescicular breath sounds heard equally.  No crepitations/ wheeze.  ABDOMEN:  No distention.  No renal artery bruits heard  Liver and spleen not palpable.  No other mass or tenderness.  NEUROLOGICAL: .Reflexes are bilaterallyNormal at biceps  JOINTS:  Normal peripheral joints.   ASSESSMENT:   HYPERTENSION, severe and currently uncontrolled She may well have primary hyperaldosteronism because of her mildly decreased potassium level which appears relatively chronic Although her plasma aldosterone level is borderline at 10.9 with the suggestive cut off being 10.0 the lab was drawn late morning Low renin is not unusual for her ethnic status She is monitoring at home but not clear if her serious monitor is accurate    PLAN:   Further evaluation with salt loading test In order to do this more accurately she will have to stop losartan For now because of her severe hypertension will however start a combination of labetalol 100 mg twice a day and doxazosin 4 mg daily She will use salt tablets, 3 tablets with  each meal for 3 days and collect her urine on the third day for sodium, creatinine and aldosterone Will continue to follow-up her potassium level which is recently normal Meanwhile encouraged her to start regular exercise program for improving her weight She also needs to cut back on eating out at restaurants and getting high sodium foods  Consultation note sent to the referring physician  [facility] [place] 09/15/2016, 11:10 AM

## 2016-10-27 ENCOUNTER — Ambulatory Visit: Payer: BLUE CROSS/BLUE SHIELD | Admitting: Endocrinology

## 2016-11-17 ENCOUNTER — Ambulatory Visit (INDEPENDENT_AMBULATORY_CARE_PROVIDER_SITE_OTHER): Payer: BLUE CROSS/BLUE SHIELD | Admitting: Physician Assistant

## 2016-11-17 ENCOUNTER — Encounter: Payer: Self-pay | Admitting: Physician Assistant

## 2016-11-17 VITALS — BP 155/107 | HR 88 | Temp 98.8°F | Resp 16 | Ht 62.0 in | Wt 215.4 lb

## 2016-11-17 DIAGNOSIS — I10 Essential (primary) hypertension: Secondary | ICD-10-CM

## 2016-11-17 NOTE — Progress Notes (Signed)
PRIMARY CARE AT [facility] [address] [place] [address] [phone]  Date:  11/17/2016   Name:  Laurie Choi   DOB:  07-May-1980   MRN:  [id number]  PCP:  Kathrene Alu, MD    History of Present Illness:  Laurie Choi is a 36 y.o. female patient who presents to PCP with  Chief Complaint  Patient presents with  . Hypertension    per patient, see several providers for HTN, no medication is working for her. Patient states that she has not taken ay medication today due to her sleeping all day     Recent diagnosis of hypertension.  12/2015 had vision changes, headache--hospitalized for possible stroke and diagnosis of hypertension.  Prior to this, she reports syncopal episode at the gym.  They diagnosed it with dehydration.  Complex migraines.   She did not follow up with dr. Jerline Pain.   No history of hypertension with pregnancies.   Menses changed heavy.   No chest pains, or palpitations.  She also complains of rash that occur that is extremely pruritic.  She will have any scar or recent injury or cut, have the first redness and swelling, then it will erupt along her body.  She will take benadryl 50 mg which helps but makes her excessively sleepy. She has also noticed bouts of small bump on her hand that has swelling, then her whole hand will swell.  Last episode was less than 1 week ago.  This has since resolved. Family history with mom with lupus.  RA MGM  Wt Readings from Last 3 Encounters:  11/17/16 215 lb 6.4 oz (97.7 kg)  09/15/16 213 lb 12.8 oz (97 kg)  08/26/16 212 lb (96.2 kg)     Patient Active Problem List   Diagnosis Date Noted  . Accelerated hypertension 08/18/2016  . Anxiety state 08/18/2016  . Heart failure with preserved ejection fraction ([place]) 12/24/2015  . Bilateral hand pain 12/24/2015  . Depression 12/24/2015  . Atypical migraine 12/20/2015  . Severe obesity (BMI >= 40) (HCC)   . Elevated blood pressure reading     Past Medical History:   Diagnosis Date  . Benign essential HTN   . Hyperemesis gravidarum before end of [redacted] week gestation, dehydration     Past Surgical History:  Procedure Laterality Date  . PICC LINE PLACE PERIPHERAL ([place] HX)  2011   for rx and hydration 2nd hyperemesis    Social History  Substance Use Topics  . Smoking status: Never Smoker  . Smokeless tobacco: Never Used  . Alcohol use No    Family History  Problem Relation Age of Onset  . Lupus Mother 35  . Hypertension Father   . Diabetes Father   . Hypertension Paternal Grandmother   . Hypertension Paternal Grandfather     Allergies  Allergen Reactions  . Tape Other (See Comments)    Medical redness, rash itching     Medication list has been reviewed and updated.  Current Outpatient Prescriptions on File Prior to Visit  Medication Sig Dispense Refill  . hydrOXYzine (ATARAX/VISTARIL) 50 MG tablet Take 1 tablet (50 mg total) by mouth 3 (three) times daily as needed. (Patient taking differently: Take 50 mg by mouth 3 (three) times daily as needed for anxiety or itching. ) 90 tablet 1  . doxazosin (CARDURA) 4 MG tablet Take 1 tablet (4 mg total) by mouth daily. (Patient not taking: Reported on 11/17/2016) 30 tablet 1  . labetalol (NORMODYNE) 100 MG  tablet Take 1 tablet (100 mg total) by mouth 2 (two) times daily. (Patient not taking: Reported on 11/17/2016) 60 tablet 2   No current facility-administered medications on file prior to visit.     ROS ROS otherwise unremarkable unless listed above.  Physical Examination: BP (!) 155/107 (BP Location: Right Arm, Patient Position: Sitting, Cuff Size: Large)   Pulse 88   Temp 98.8 F (37.1 C) (Oral)   Resp 16   Ht '5\' 2"'  (1.575 m)   Wt 215 lb 6.4 oz (97.7 kg)   SpO2 97%   BMI 39.40 kg/m  Ideal Body Weight: Weight in (lb) to have BMI = 25: 136.4  Physical Exam  Constitutional: She is oriented to person, place, and time. She appears well-developed and well-nourished. No distress.   HENT:  Head: Normocephalic and atraumatic.  Right Ear: External ear normal.  Left Ear: External ear normal.  Eyes: Pupils are equal, round, and reactive to light. Conjunctivae and EOM are normal.  Cardiovascular: Normal rate, regular rhythm, normal heart sounds and intact distal pulses.  Exam reveals no friction rub.   No murmur heard. Pulmonary/Chest: Effort normal and breath sounds normal. No respiratory distress.  Neurological: She is alert and oriented to person, place, and time. No cranial nerve deficit.  Skin: She is not diaphoretic.  Psychiatric: She has a normal mood and affect. Her behavior is normal.     Assessment and Plan: Laurie Choi is a 36 y.o. female who is here today for cc of  Chief Complaint  Patient presents with  . Hypertension    per patient, see several providers for HTN, no medication is working for her. Patient states that she has not taken ay medication today due to her sleeping all day  follow up pending results.  Hypertension, unspecified type - Plan: CMP14+EGFR, Sedimentation Rate, C-reactive protein, CBC  Ivar Drape, PA-C [facility] 9/23/20183:52 PM

## 2016-11-17 NOTE — Patient Instructions (Addendum)
Avoid fried foods.  Eating fried food once per week.  Cut down sodas down to zero, as you had stated.    DASH Eating Plan DASH stands for "Dietary Approaches to Stop Hypertension." The DASH eating plan is a healthy eating plan that has been shown to reduce high blood pressure (hypertension). It may also reduce your risk for type 2 diabetes, heart disease, and stroke. The DASH eating plan may also help with weight loss. What are tips for following this plan? General guidelines  Avoid eating more than 2,300 mg (milligrams) of salt (sodium) a day. If you have hypertension, you may need to reduce your sodium intake to 1,500 mg a day.  Limit alcohol intake to no more than 1 drink a day for nonpregnant women and 2 drinks a day for men. One drink equals 12 oz of beer, 5 oz of wine, or 1 oz of hard liquor.  Work with your health care provider to maintain a healthy body weight or to lose weight. Ask what an ideal weight is for you.  Get at least 30 minutes of exercise that causes your heart to beat faster (aerobic exercise) most days of the week. Activities may include walking, swimming, or biking.  Work with your health care provider or diet and nutrition specialist (dietitian) to adjust your eating plan to your individual calorie needs. Reading food labels  Check food labels for the amount of sodium per serving. Choose foods with less than 5 percent of the Daily Value of sodium. Generally, foods with less than 300 mg of sodium per serving fit into this eating plan.  To find whole grains, look for the word "whole" as the first word in the ingredient list. Shopping  Buy products labeled as "low-sodium" or "no salt added."  Buy fresh foods. Avoid canned foods and premade or frozen meals. Cooking  Avoid adding salt when cooking. Use salt-free seasonings or herbs instead of table salt or sea salt. Check with your health care provider or pharmacist before using salt substitutes.  Do not fry foods.  Cook foods using healthy methods such as baking, boiling, grilling, and broiling instead.  Cook with heart-healthy oils, such as olive, canola, soybean, or sunflower oil. Meal planning   Eat a balanced diet that includes: ? 5 or more servings of fruits and vegetables each day. At each meal, try to fill half of your plate with fruits and vegetables. ? Up to 6-8 servings of whole grains each day. ? Less than 6 oz of lean meat, poultry, or fish each day. A 3-oz serving of meat is about the same size as a deck of cards. One egg equals 1 oz. ? 2 servings of low-fat dairy each day. ? A serving of nuts, seeds, or beans 5 times each week. ? Heart-healthy fats. Healthy fats called Omega-3 fatty acids are found in foods such as flaxseeds and coldwater fish, like sardines, salmon, and mackerel.  Limit how much you eat of the following: ? Canned or prepackaged foods. ? Food that is high in trans fat, such as fried foods. ? Food that is high in saturated fat, such as fatty meat. ? Sweets, desserts, sugary drinks, and other foods with added sugar. ? Full-fat dairy products.  Do not salt foods before eating.  Try to eat at least 2 vegetarian meals each week.  Eat more home-cooked food and less restaurant, buffet, and fast food.  When eating at a restaurant, ask that your food be prepared with less salt  or no salt, if possible. What foods are recommended? The items listed may not be a complete list. Talk with your dietitian about what dietary choices are best for you. Grains Whole-grain or whole-wheat bread. Whole-grain or whole-wheat pasta. Brown rice. [place]. Bulgur. Whole-grain and low-sodium cereals. Pita bread. Low-fat, low-sodium crackers. Whole-wheat flour tortillas. Vegetables Fresh or frozen vegetables (raw, steamed, roasted, or grilled). Low-sodium or reduced-sodium tomato and vegetable juice. Low-sodium or reduced-sodium tomato sauce and tomato paste. Low-sodium or reduced-sodium  canned vegetables. Fruits All fresh, dried, or frozen fruit. Canned fruit in natural juice (without added sugar). Meat and other protein foods Skinless chicken or [place]. Ground chicken or [place]. Pork with fat trimmed off. Fish and seafood. Egg whites. Dried beans, peas, or lentils. Unsalted nuts, nut butters, and seeds. Unsalted canned beans. Lean cuts of beef with fat trimmed off. Low-sodium, lean deli meat. Dairy Low-fat (1%) or fat-free (skim) milk. Fat-free, low-fat, or reduced-fat cheeses. Nonfat, low-sodium ricotta or cottage cheese. Low-fat or nonfat yogurt. Low-fat, low-sodium cheese. Fats and oils Soft margarine without trans fats. Vegetable oil. Low-fat, reduced-fat, or light mayonnaise and salad dressings (reduced-sodium). Canola, safflower, olive, soybean, and sunflower oils. Avocado. Seasoning and other foods Herbs. Spices. Seasoning mixes without salt. Unsalted popcorn and pretzels. Fat-free sweets. What foods are not recommended? The items listed may not be a complete list. Talk with your dietitian about what dietary choices are best for you. Grains Baked goods made with fat, such as croissants, muffins, or some breads. Dry pasta or rice meal packs. Vegetables Creamed or fried vegetables. Vegetables in a cheese sauce. Regular canned vegetables (not low-sodium or reduced-sodium). Regular canned tomato sauce and paste (not low-sodium or reduced-sodium). Regular tomato and vegetable juice (not low-sodium or reduced-sodium). Angie Fava. Olives. Fruits Canned fruit in a light or heavy syrup. Fried fruit. Fruit in cream or butter sauce. Meat and other protein foods Fatty cuts of meat. Ribs. Fried meat. Berniece Salines. Sausage. Bologna and other processed lunch meats. Salami. Fatback. Hotdogs. Bratwurst. Salted nuts and seeds. Canned beans with added salt. Canned or smoked fish. Whole eggs or egg yolks. Chicken or [place] with skin. Dairy Whole or 2% milk, cream, and half-and-half. Whole or  full-fat cream cheese. Whole-fat or sweetened yogurt. Full-fat cheese. Nondairy creamers. Whipped toppings. Processed cheese and cheese spreads. Fats and oils Butter. Stick margarine. Lard. Shortening. Ghee. Bacon fat. Tropical oils, such as coconut, palm kernel, or palm oil. Seasoning and other foods Salted popcorn and pretzels. Onion salt, garlic salt, seasoned salt, table salt, and sea salt. Worcestershire sauce. Tartar sauce. Barbecue sauce. Teriyaki sauce. Soy sauce, including reduced-sodium. Steak sauce. Canned and packaged gravies. Fish sauce. Oyster sauce. Cocktail sauce. Horseradish that you find on the shelf. Ketchup. Mustard. Meat flavorings and tenderizers. Bouillon cubes. Hot sauce and Tabasco sauce. Premade or packaged marinades. Premade or packaged taco seasonings. Relishes. Regular salad dressings. Where to find more information:  National Heart, Lung, and [place]: https://wilson-eaton.com/  American Heart Association: www.heart.org Summary  The DASH eating plan is a healthy eating plan that has been shown to reduce high blood pressure (hypertension). It may also reduce your risk for type 2 diabetes, heart disease, and stroke.  With the DASH eating plan, you should limit salt (sodium) intake to 2,300 mg a day. If you have hypertension, you may need to reduce your sodium intake to 1,500 mg a day.  When on the DASH eating plan, aim to eat more fresh fruits and vegetables, whole grains, lean proteins, low-fat dairy,  and heart-healthy fats.  Work with your health care provider or diet and nutrition specialist (dietitian) to adjust your eating plan to your individual calorie needs. This information is not intended to replace advice given to you by your health care provider. Make sure you discuss any questions you have with your health care provider. Document Released: 02/05/2011 Document Revised: 02/10/2016 Document Reviewed: 02/10/2016 Elsevier Interactive Patient Education  2017  [employer].     IF you received an x-ray today, you will receive an invoice from [facility]. Please contact [facility] at [phone] with questions or concerns regarding your invoice.   IF you received labwork today, you will receive an invoice from [facility]. Please contact [facility] at [phone] with questions or concerns regarding your invoice.   Our billing staff will not be able to assist you with questions regarding bills from these companies.  You will be contacted with the lab results as soon as they are available. The fastest way to get your results is to activate your My Chart account. Instructions are located on the last page of this paperwork. If you have not heard from [place] regarding the results in 2 weeks, please contact this office.

## 2016-11-18 LAB — CBC
HEMOGLOBIN: 13 g/dL (ref 11.1–15.9)
Hematocrit: 37.4 % (ref 34.0–46.6)
MCH: 29.7 pg (ref 26.6–33.0)
MCHC: 34.8 g/dL (ref 31.5–35.7)
MCV: 86 fL (ref 79–97)
Platelets: 331 10*3/uL (ref 150–379)
RBC: 4.37 x10E6/uL (ref 3.77–5.28)
RDW: 14.5 % (ref 12.3–15.4)
WBC: 5.9 10*3/uL (ref 3.4–10.8)

## 2016-11-18 LAB — CMP14+EGFR
ALBUMIN: 4.5 g/dL (ref 3.5–5.5)
ALK PHOS: 71 IU/L (ref 39–117)
ALT: 14 IU/L (ref 0–32)
AST: 16 IU/L (ref 0–40)
Albumin/Globulin Ratio: 1.9 (ref 1.2–2.2)
BILIRUBIN TOTAL: 0.6 mg/dL (ref 0.0–1.2)
BUN / CREAT RATIO: 13 (ref 9–23)
BUN: 10 mg/dL (ref 6–20)
CHLORIDE: 105 mmol/L (ref 96–106)
CO2: 25 mmol/L (ref 20–29)
Calcium: 9.6 mg/dL (ref 8.7–10.2)
Creatinine, Ser: 0.77 mg/dL (ref 0.57–1.00)
GFR calc non Af Amer: 99 mL/min/{1.73_m2} (ref >59)
GFR, EST AFRICAN AMERICAN: 114 mL/min/{1.73_m2} (ref >59)
GLOBULIN, TOTAL: 2.4 g/dL (ref 1.5–4.5)
GLUCOSE: 98 mg/dL (ref 65–99)
Potassium: 4 mmol/L (ref 3.5–5.2)
SODIUM: 143 mmol/L (ref 134–144)
TOTAL PROTEIN: 6.9 g/dL (ref 6.0–8.5)

## 2016-11-18 LAB — SEDIMENTATION RATE: SED RATE: 16 mm/h (ref 0–32)

## 2016-11-18 LAB — C-REACTIVE PROTEIN: CRP: 13.9 mg/L — ABNORMAL HIGH (ref 0.0–4.9)

## 2016-11-24 ENCOUNTER — Encounter: Payer: Self-pay | Admitting: Physician Assistant

## 2016-12-02 ENCOUNTER — Other Ambulatory Visit: Payer: Self-pay | Admitting: Physician Assistant

## 2016-12-02 DIAGNOSIS — R7982 Elevated C-reactive protein (CRP): Secondary | ICD-10-CM

## 2016-12-29 DIAGNOSIS — I10 Essential (primary) hypertension: Secondary | ICD-10-CM | POA: Diagnosis not present

## 2016-12-29 DIAGNOSIS — Z6839 Body mass index (BMI) 39.0-39.9, adult: Secondary | ICD-10-CM | POA: Diagnosis not present

## 2016-12-29 DIAGNOSIS — Z79899 Other long term (current) drug therapy: Secondary | ICD-10-CM | POA: Diagnosis not present

## 2016-12-29 DIAGNOSIS — E669 Obesity, unspecified: Secondary | ICD-10-CM | POA: Diagnosis not present

## 2016-12-29 DIAGNOSIS — E269 Hyperaldosteronism, unspecified: Secondary | ICD-10-CM | POA: Diagnosis not present

## 2016-12-31 DIAGNOSIS — I10 Essential (primary) hypertension: Secondary | ICD-10-CM | POA: Diagnosis not present

## 2017-02-04 DIAGNOSIS — M2550 Pain in unspecified joint: Secondary | ICD-10-CM | POA: Diagnosis not present

## 2017-02-04 DIAGNOSIS — R918 Other nonspecific abnormal finding of lung field: Secondary | ICD-10-CM | POA: Diagnosis not present

## 2017-02-04 DIAGNOSIS — M791 Myalgia, unspecified site: Secondary | ICD-10-CM | POA: Diagnosis not present

## 2017-02-04 DIAGNOSIS — M7989 Other specified soft tissue disorders: Secondary | ICD-10-CM | POA: Diagnosis not present

## 2017-02-04 DIAGNOSIS — M79641 Pain in right hand: Secondary | ICD-10-CM | POA: Diagnosis not present

## 2017-02-04 DIAGNOSIS — R21 Rash and other nonspecific skin eruption: Secondary | ICD-10-CM | POA: Diagnosis not present

## 2017-02-04 DIAGNOSIS — R7982 Elevated C-reactive protein (CRP): Secondary | ICD-10-CM | POA: Diagnosis not present

## 2017-02-04 DIAGNOSIS — M79642 Pain in left hand: Secondary | ICD-10-CM | POA: Diagnosis not present

## 2017-02-17 DIAGNOSIS — R21 Rash and other nonspecific skin eruption: Secondary | ICD-10-CM | POA: Diagnosis not present

## 2017-02-17 DIAGNOSIS — M791 Myalgia, unspecified site: Secondary | ICD-10-CM | POA: Diagnosis not present

## 2017-02-17 DIAGNOSIS — M2550 Pain in unspecified joint: Secondary | ICD-10-CM | POA: Diagnosis not present

## 2017-02-17 DIAGNOSIS — R7982 Elevated C-reactive protein (CRP): Secondary | ICD-10-CM | POA: Diagnosis not present

## 2017-04-06 DIAGNOSIS — N920 Excessive and frequent menstruation with regular cycle: Secondary | ICD-10-CM | POA: Diagnosis not present

## 2017-04-06 DIAGNOSIS — Z01419 Encounter for gynecological examination (general) (routine) without abnormal findings: Secondary | ICD-10-CM | POA: Diagnosis not present

## 2017-04-06 DIAGNOSIS — I10 Essential (primary) hypertension: Secondary | ICD-10-CM | POA: Diagnosis not present

## 2017-04-06 DIAGNOSIS — Z319 Encounter for procreative management, unspecified: Secondary | ICD-10-CM | POA: Diagnosis not present

## 2017-04-06 DIAGNOSIS — Z3040 Encounter for surveillance of contraceptives, unspecified: Secondary | ICD-10-CM | POA: Diagnosis not present

## 2017-04-17 ENCOUNTER — Encounter: Payer: Self-pay | Admitting: Physician Assistant

## 2017-04-17 DIAGNOSIS — N920 Excessive and frequent menstruation with regular cycle: Secondary | ICD-10-CM | POA: Insufficient documentation

## 2017-06-09 ENCOUNTER — Encounter: Payer: Self-pay | Admitting: Physician Assistant

## 2017-10-20 ENCOUNTER — Other Ambulatory Visit: Payer: Self-pay | Admitting: Family Medicine

## 2017-11-11 ENCOUNTER — Other Ambulatory Visit: Payer: Self-pay

## 2017-11-11 MED ORDER — LABETALOL HCL 100 MG PO TABS: 100.0000 mg | ORAL_TABLET | Freq: Two times a day (BID) | ORAL | 2 refills | Status: DC

## 2017-11-11 NOTE — Telephone Encounter (Signed)
Pt called nurse line stating she is out of her BP medication. Refills were sent to Dr. Jerline Pain instead of to [place]. Pt has not been seen in over a year, appt scheduled with Dr. Shan Levans for Monday AM. Pt states her BP is very high this morning [id number] systolic and has been having chest pain. Advised to go to ED for evaluation. Would like refill of meds until her appt next week. [place] call back [phone] Wallace Cullens, RN

## 2017-11-13 LAB — GLUCOSE, POCT (MANUAL RESULT ENTRY): POC GLUCOSE: 93 mg/dL (ref 70–99)

## 2017-11-16 ENCOUNTER — Ambulatory Visit: Payer: BLUE CROSS/BLUE SHIELD

## 2017-11-19 ENCOUNTER — Encounter: Payer: Self-pay | Admitting: Cardiovascular Disease

## 2017-11-19 ENCOUNTER — Ambulatory Visit: Payer: BLUE CROSS/BLUE SHIELD | Admitting: Cardiovascular Disease

## 2017-11-19 VITALS — BP 166/96 | HR 78 | Ht 62.0 in | Wt 218.6 lb

## 2017-11-19 DIAGNOSIS — Z13220 Encounter for screening for lipoid disorders: Secondary | ICD-10-CM | POA: Diagnosis not present

## 2017-11-19 DIAGNOSIS — Z5181 Encounter for therapeutic drug level monitoring: Secondary | ICD-10-CM | POA: Diagnosis not present

## 2017-11-19 DIAGNOSIS — I10 Essential (primary) hypertension: Secondary | ICD-10-CM

## 2017-11-19 MED ORDER — AMLODIPINE BESYLATE 5 MG PO TABS: 5.0000 mg | ORAL_TABLET | Freq: Every day | ORAL | 1 refills | Status: DC

## 2017-11-19 MED ORDER — CHLORTHALIDONE 25 MG PO TABS: 25.0000 mg | ORAL_TABLET | Freq: Every day | ORAL | 1 refills | Status: DC

## 2017-11-19 NOTE — Progress Notes (Signed)
Cardiology Office Note   Date:  11/19/2017   ID:  Laurie Choi, DOB 1980/10/10, MRN [id number]  PCP:  Kathrene Alu, MD  Cardiologist:   Skeet Latch, MD   Chief Complaint  Patient presents with  . Follow-up  . Chest Pain    tightness at random.       History of Present Illness: Laurie Choi is a 37 y.o. female with hypertension, syncope, and complex migraines who presents for follow up.  Laurie Choi was seen by [name] 03/2016 after a syncopal episode and chest pain. Her chest pain was not exertional.  She had an echo 12/2015 that revealed LVEF 55 to 60% with moderate LVH and grade 2 diastolic dysfunction.  She was referred or a coronary calcium score but never had it.  Laurie Choi has been feeling well.  She was diagnosed with hypertension several years ago.  Her BP has never been well-controlled.  She ran out of her medication one week ago but was unable to refill it because her former PCP no longer works in that office.  A new provider started her on labetalol.  Prior to that she was taking losartan but is afraid of the recalls on ARBs.  She sometimes checks her BP in the grocery store and it typically is in the 782U systolic.  She thinks it is somewhat better since she started taking apple cider vinegar.    Laurie Choi has not been exercising.  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  She has no exertional chest pain but felt chest tightness when doing her daughter's hair.  She sometimes has to stop and take a deep breath even when just sitting.  She sometimes notices this when lying down.  She snores and doesn't feel rested in the AM.  However she only sleeps 5 hours per night.  She has occasional edema in her ankles that improves with elevation of her legs.  She has no orthopnea or PND.  She notes that her diet is poor.   Past Medical History:  Diagnosis Date  . Benign essential HTN   . Hyperemesis gravidarum before end of [redacted] week  gestation, dehydration     Past Surgical History:  Procedure Laterality Date  . PICC LINE PLACE PERIPHERAL ([facility] HX)  2011   for rx and hydration 2nd hyperemesis     Current Outpatient Medications  Medication Sig Dispense Refill  . amLODipine (NORVASC) 5 MG tablet Take 1 tablet (5 mg total) by mouth daily. 90 tablet 1  . chlorthalidone (HYGROTON) 25 MG tablet Take 1 tablet (25 mg total) by mouth daily. 90 tablet 1   No current facility-administered medications for this visit.     Allergies:   Tape    Social History:  The patient  reports that she has never smoked. She has never used smokeless tobacco. She reports that she does not drink alcohol or use drugs.   Family History:  The patient's family history includes Diabetes in her father; Heart attack in her maternal grandmother; Hypertension in her father, paternal grandfather, and paternal grandmother; Lupus (age of onset: 70) in her mother.    ROS:  Please see the history of present illness.   Otherwise, review of systems are positive for none.   All other systems are reviewed and negative.    PHYSICAL EXAM: VS:  BP (!) 166/96   Pulse 78   Ht 5\' 2"  (1.575 m)   Wt 218  lb 9.6 oz (99.2 kg)   BMI 39.98 kg/m  , BMI Body mass index is 39.98 kg/m. GENERAL:  Well appearing HEENT:  Pupils equal round and reactive, fundi not visualized, oral mucosa unremarkable NECK:  No jugular venous distention, waveform within normal limits, carotid upstroke brisk and symmetric, no bruits, no thyromegaly LYMPHATICS:  No cervical adenopathy LUNGS:  Clear to auscultation bilaterally HEART:  RRR.  PMI not displaced or sustained,S1 and S2 within normal limits, no S3, no S4, no clicks, no rubs, no  murmurs ABD:  Flat, positive bowel sounds normal in frequency in pitch, no bruits, no rebound, no guarding, no midline pulsatile mass, no hepatomegaly, no splenomegaly EXT:  2 plus pulses throughout, no edema, no cyanosis no clubbing SKIN:  No rashes no  nodules NEURO:  Cranial nerves II through XII grossly intact, motor grossly intact throughout PSYCH:  Cognitively intact, oriented to person place and time   EKG:  EKG is ordered today. The ekg ordered today demonstrates sinus rhythm.  Rate 78 bpm.    Echo 12/2015: Study Conclusions  - Left ventricle: The cavity size was normal. Wall thickness was   increased in a pattern of moderate LVH. Systolic function was   normal. The estimated ejection fraction was in the range of 55%   to 60%. Wall motion was normal; there were no regional wall   motion abnormalities. Features are consistent with a pseudonormal   left ventricular filling pattern, with concomitant abnormal   relaxation and increased filling pressure (grade 2 diastolic   dysfunction).  Will not bring her back   Recent Labs: No results found for requested labs within last 8760 hours.    Lipid Panel    Component Value Date/Time   CHOL 200 12/20/2015 1108   TRIG 62 12/20/2015 1108   HDL 51 12/20/2015 1108   CHOLHDL 3.9 12/20/2015 1108   VLDL 12 12/20/2015 1108   LDLCALC 137 (H) 12/20/2015 1108      Wt Readings from Last 3 Encounters:  11/19/17 218 lb 9.6 oz (99.2 kg)  11/17/16 215 lb 6.4 oz (97.7 kg)  09/15/16 213 lb 12.8 oz (97 kg)      ASSESSMENT AND PLAN:  # Hypertension:  BP poorly controlled.  We will stop both losartan and labetalol.  Start Chlorthalidone 25mg  dialy and amlodipine 5mg  daily.  She will track her BP and bring back for follow up.  BMP in 1 week.  # Morbid obesity: We had a long discussion about the imporatnce of diet and exercise.  She isn't making time for herself.  Her goal is 150 minutes of exercise per week.  She will start scheduling exercise time and think about at least one dietary change.  We will also have her see our Care Guide to help with these behavior changes.  # Grade 2 diastolic dysfunction: Laurie Choi has diastolic dysfunction and LVH 2/2 poorly controlled hypertension.   She has no heart failure symptoms.  BP management as above.   Current medicines are reviewed at length with the patient today.  The patient does not have concerns regarding medicines.  The following changes have been made:  Stop labetalol and losartan.  Start HCTZ and amlodipine.   Labs/ tests ordered today include:   Orders Placed This Encounter  Procedures  . Lipid panel  . Comprehensive metabolic panel  . EKG 12-Lead     Disposition:   FU with Laurie Lomeli C. Oval Linsey, MD, [facility] in 3 months.  PharmD in 2-4 weeks.  Signed, Lynlee Stratton C. Oval Linsey, MD, [facility]  11/19/2017 8:18 PM    [place]

## 2017-11-19 NOTE — Patient Instructions (Signed)
Medication Instructions:  STOP LABETALOL   STOP LOSARTAN   START CHLORTHALIDONE 25 MG DAILY  START AMLODIPINE 5 MG DAILY   Labwork: FASTING LP/CMET IN 1 WEEK   Testing/Procedures: NONE  Follow-Up: Your physician recommends that you schedule a follow-up appointment in: [place] physician recommends that you schedule a follow-up appointment in: DR [place]   If you need a refill on your cardiac medications before your next appointment, please call your pharmacy.

## 2017-11-23 ENCOUNTER — Telehealth: Payer: Self-pay

## 2017-11-23 NOTE — Telephone Encounter (Signed)
Called pt to set up initial care guide visit. Scheduled for 9/25 at 8:00

## 2017-11-24 ENCOUNTER — Ambulatory Visit (INDEPENDENT_AMBULATORY_CARE_PROVIDER_SITE_OTHER): Payer: BLUE CROSS/BLUE SHIELD

## 2017-11-24 DIAGNOSIS — Z0000 Encounter for general adult medical examination without abnormal findings: Secondary | ICD-10-CM

## 2017-11-24 NOTE — Progress Notes (Deleted)
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 Enjoys snacking on pistachios and grapes, and normally, would make homemade meals for her family. As of lately, pt has been busy with maintaining children schedule that she has not been able to cook as much as she would like to. Would also like to go to the gym more, but the gym brings forth many anxieties and discomfort. Pt does have stepper that she occasionally uses while watching tv. Would like to lose 20lbs. But would like to start with 5-10lbs to gain momentum.  Challenges: Pt will often skip breakfast, and as a result, will be too hungry to cook lunch. So will go out for fast food. Pt agreed to commit to breakfast from now on.    Client Commitment/Agreement for Next Session: Next apt 10/9 at 8:00 am. Pt agreed to include herself in the meal prepping for kids. Also agreed to 30 mins a day on stair-stepper, and to begin to make her self comfortable with the gym. Also agreed to reduce fast food outings to 3x a week.

## 2017-12-01 DIAGNOSIS — R2231 Localized swelling, mass and lump, right upper limb: Secondary | ICD-10-CM | POA: Diagnosis not present

## 2017-12-01 DIAGNOSIS — I10 Essential (primary) hypertension: Secondary | ICD-10-CM | POA: Diagnosis not present

## 2017-12-01 DIAGNOSIS — E669 Obesity, unspecified: Secondary | ICD-10-CM | POA: Diagnosis not present

## 2017-12-03 ENCOUNTER — Ambulatory Visit: Payer: BLUE CROSS/BLUE SHIELD | Admitting: Cardiovascular Disease

## 2017-12-03 ENCOUNTER — Ambulatory Visit (INDEPENDENT_AMBULATORY_CARE_PROVIDER_SITE_OTHER): Payer: BLUE CROSS/BLUE SHIELD | Admitting: Pharmacist

## 2017-12-03 ENCOUNTER — Encounter: Payer: Self-pay | Admitting: Pharmacist

## 2017-12-03 VITALS — BP 110/72 | HR 76 | Ht 62.0 in | Wt 217.2 lb

## 2017-12-03 DIAGNOSIS — I10 Essential (primary) hypertension: Secondary | ICD-10-CM

## 2017-12-03 DIAGNOSIS — Z13220 Encounter for screening for lipoid disorders: Secondary | ICD-10-CM | POA: Diagnosis not present

## 2017-12-03 DIAGNOSIS — Z5181 Encounter for therapeutic drug level monitoring: Secondary | ICD-10-CM | POA: Diagnosis not present

## 2017-12-03 LAB — COMPREHENSIVE METABOLIC PANEL
A/G RATIO: 1.5 (ref 1.2–2.2)
ALK PHOS: 68 IU/L (ref 39–117)
ALT: 17 IU/L (ref 0–32)
AST: 16 IU/L (ref 0–40)
Albumin: 4.2 g/dL (ref 3.5–5.5)
BILIRUBIN TOTAL: 0.6 mg/dL (ref 0.0–1.2)
BUN / CREAT RATIO: 20 (ref 9–23)
BUN: 14 mg/dL (ref 6–20)
CHLORIDE: 97 mmol/L (ref 96–106)
CO2: 23 mmol/L (ref 20–29)
Calcium: 9.5 mg/dL (ref 8.7–10.2)
Creatinine, Ser: 0.7 mg/dL (ref 0.57–1.00)
GFR calc non Af Amer: 110 mL/min/{1.73_m2} (ref >59)
GFR, EST AFRICAN AMERICAN: 127 mL/min/{1.73_m2} (ref >59)
GLUCOSE: 98 mg/dL (ref 65–99)
Globulin, Total: 2.8 g/dL (ref 1.5–4.5)
POTASSIUM: 3.5 mmol/L (ref 3.5–5.2)
Sodium: 137 mmol/L (ref 134–144)
TOTAL PROTEIN: 7 g/dL (ref 6.0–8.5)

## 2017-12-03 LAB — LIPID PANEL
Chol/HDL Ratio: 3.3 ratio (ref 0.0–4.4)
Cholesterol, Total: 179 mg/dL (ref 100–199)
HDL: 54 mg/dL (ref >39)
LDL Calculated: 114 mg/dL — ABNORMAL HIGH (ref 0–99)
TRIGLYCERIDES: 57 mg/dL (ref 0–149)
VLDL CHOLESTEROL CAL: 11 mg/dL (ref 5–40)

## 2017-12-03 MED ORDER — CHLORTHALIDONE 25 MG PO TABS: 12.5000 mg | ORAL_TABLET | Freq: Every day | ORAL | 0 refills | Status: DC

## 2017-12-03 NOTE — Assessment & Plan Note (Addendum)
Blood pressure is excellent today but Laurie Choi is experiencing symptoms due to rapid decrease in blood pressure. Repeat BMET shows stable renal function and electrolytes. Patient is also working on positive lifestyle modifications and has an appointment with our Care Guide next week.   Will decrease chlorthalidone to 12.5mg  daily to improve tolerability, continue positive lifestyle modifications, and follow up in 4 weeks. Patient is to monitor BP day and bring records to follow up visit. Plan to resume chlorthalidone 25mg  daily if needed once she is feeling more energetic.

## 2017-12-03 NOTE — Progress Notes (Signed)
Patient ID: Laurie Choi                 DOB: [birth year]                      MRN: [id number]     HPI: Laurie Choi is a 37 y.o. female referred by Dr. Oval Linsey to HTN clinic. PMH includes uncontrolled hypertension, syncope, complex migraines,  moderate LVH and grade 2 diastolic dysfunction, and compliance issues. During most recent visit with Dr Oval Linsey her whole HTN regimen was changed and patient is currently on amlodipine 5mg  daily and chlorthalidone 25mg  daily.  Patient presents today accompany by her 2 daughters. He reports felling extremely tired during 1st week of therapy but somehow improve at this time.  She still experiencing some fatigue and is currently working on positive lifestyle modifications. Denies swelling, chest pain or dizziness.   Current HTN meds:  Amlodipine 5mg  daily Chlorthalidone 25mg  daily  Previously tried:  Labetalol 100mg  twice daily Doxazosin 4mg  daily Labetalol  - stopped due to recall  BP goal: <130/80  Family History: family history includes Diabetes in her father; Heart attack in her maternal grandmother; Hypertension in her father, paternal grandfather, and paternal grandmother; Lupus (age of onset: 74) in her mother.   Social History: patient  reports that she has never smoked. She has never used smokeless tobacco. She reports that she does not drink alcohol or use drugs.   Diet: working on decrease sodium diet   Exercise: starting to use her stepper but feeling very tired  Home BP readings: no records avialable  Wt Readings from Last 3 Encounters:  12/03/17 217 lb 3.2 oz (98.5 kg)  11/19/17 218 lb 9.6 oz (99.2 kg)  11/17/16 215 lb 6.4 oz (97.7 kg)   BP Readings from Last 3 Encounters:  12/03/17 110/72  11/19/17 (!) 166/96  11/17/16 (!) 155/107   Pulse Readings from Last 3 Encounters:  12/03/17 76  11/19/17 78  11/17/16 88    Past Medical History:  Diagnosis Date  . Benign essential HTN   . Hyperemesis gravidarum  before end of [redacted] week gestation, dehydration     Current Outpatient Medications on File Prior to Visit  Medication Sig Dispense Refill  . amLODipine (NORVASC) 5 MG tablet Take 1 tablet (5 mg total) by mouth daily. 90 tablet 1   No current facility-administered medications on file prior to visit.     Allergies  Allergen Reactions  . Tape Other (See Comments)    Medical redness, rash itching     Blood pressure 110/72, pulse 76, height 5\' 2"  (1.575 m), weight 217 lb 3.2 oz (98.5 kg).  Accelerated hypertension Blood pressure is excellent today but Laurie Choi is experiencing symptoms due to rapid decrease in blood pressure. Repeat BMET shows stable renal function and electrolytes. Patient is also working on positive lifestyle modifications and has an appointment with our Care Guide next week.   Will decrease chlorthalidone to 12.5mg  daily to improve tolerability, continue positive lifestyle modifications, and follow up in 4 weeks. Patient is to monitor BP day and bring records to follow up visit. Plan to resume chlorthalidone 25mg  daily if needed once she is feeling more energetic.   Chalon Zobrist Rodriguez-Guzman PharmD, BCPS, [place] [address] [place] [address] 12/03/2017 5:18 PM

## 2017-12-03 NOTE — Patient Instructions (Signed)
Return for a  follow up appointment in 4 weeks  Go to the lab in [place] your blood pressure at home daily (if able) and keep record of the readings.  Take your BP meds as follows: *Decrease chlorthalidone dose to 12.5mg  (1/2 tablet) daily* *STAY hydrated *Avoid high temperatures  Bring all of your meds, your BP cuff and your record of home blood pressures to your next appointment.  Exercise as you're able, try to walk approximately 30 minutes per day.  Keep salt intake to a minimum, especially watch canned and prepared boxed foods.  Eat more fresh fruits and vegetables and fewer canned items.  Avoid eating in fast food restaurants.    HOW TO TAKE YOUR BLOOD PRESSURE: . Rest 5 minutes before taking your blood pressure. .  Don't smoke or drink caffeinated beverages for at least 30 minutes before. . Take your blood pressure before (not after) you eat. . Sit comfortably with your back supported and both feet on the floor (don't cross your legs). . Elevate your arm to heart level on a table or a desk. . Use the proper sized cuff. It should fit smoothly and snugly around your bare upper arm. There should be enough room to slip a fingertip under the cuff. The bottom edge of the cuff should be 1 inch above the crease of the elbow. . Ideally, take 3 measurements at one sitting and record the average.

## 2017-12-08 ENCOUNTER — Ambulatory Visit (INDEPENDENT_AMBULATORY_CARE_PROVIDER_SITE_OTHER): Payer: BLUE CROSS/BLUE SHIELD

## 2017-12-08 DIAGNOSIS — Z0000 Encounter for general adult medical examination without abnormal findings: Secondary | ICD-10-CM

## 2017-12-08 NOTE — Progress Notes (Signed)
Week: 2  Progress Notes: Pt noticed that she had a hard time keeping her goals from the last visit. However, she has been more mindful of the physical activity levels and has started to incorporate physical activity in her daily tasks. Reviewed with pt her perceived barriers to some of her goals. Identified schedule as being one of them. Worked with patient on how to incorporate goals into her current schedule.  Client Commitment/Agreement for Next Session: Pt agreed to try waking up earlier (7:30), setting aside 30 mins for snack prepping, breaking her physical activity down into 5 min increments as well as reviewing her daily schedule each evening. Next apt is 10/23 at 8:30. Will work on well life vision next session

## 2017-12-22 ENCOUNTER — Ambulatory Visit (INDEPENDENT_AMBULATORY_CARE_PROVIDER_SITE_OTHER): Payer: BLUE CROSS/BLUE SHIELD

## 2017-12-22 DIAGNOSIS — Z0000 Encounter for general adult medical examination without abnormal findings: Secondary | ICD-10-CM

## 2017-12-28 NOTE — Progress Notes (Signed)
Week: 3  Progress Notes: Pt provided updated on goals set during previous meeting. Has started preparing more meals at home, but is having a hard time eating them. Prefers cereal. Is also waking up a little earlier and is reviewing her daily schedule.   Challenges: Pt is having a hard time eating the healthy snacks that she already has available.   Client Commitment/Agreement for Next Session: Pt agreed to come to next apt prepared to review well life vision. Also committed to 5 mins of physical activity per day. Next apt is 11/7

## 2018-01-04 ENCOUNTER — Encounter: Payer: Self-pay | Admitting: Pharmacist Clinician (PhC)/ Clinical Pharmacy Specialist

## 2018-01-04 ENCOUNTER — Ambulatory Visit (INDEPENDENT_AMBULATORY_CARE_PROVIDER_SITE_OTHER): Payer: BLUE CROSS/BLUE SHIELD | Admitting: Pharmacist Clinician (PhC)/ Clinical Pharmacy Specialist

## 2018-01-04 ENCOUNTER — Ambulatory Visit (INDEPENDENT_AMBULATORY_CARE_PROVIDER_SITE_OTHER): Payer: BLUE CROSS/BLUE SHIELD

## 2018-01-04 DIAGNOSIS — I10 Essential (primary) hypertension: Secondary | ICD-10-CM

## 2018-01-04 DIAGNOSIS — Z0000 Encounter for general adult medical examination without abnormal findings: Secondary | ICD-10-CM

## 2018-01-04 NOTE — Assessment & Plan Note (Signed)
Patient doing much better with BP control on current medications.  Will not make any changes today, although I did offer to decrease the amlodipine and increase chlorthalidone, as amlodipine is more likely to cause a decrease in libido.  She was not interested in that today, but knows to call should that become an issue for her.  She is scheduled to see Dr. Oval Linsey in December and we will follow up in January to be sure she is stable on medications.

## 2018-01-04 NOTE — Patient Instructions (Signed)
Return for a a follow up appointment with Dr. Oval Linsey in December and Pharmacy in January  Your blood pressure today is 132/90  (goal is < 130/80)  Check your blood pressure at home daily and keep record of the readings.  Take your BP meds as follows:  Continue with your current medications  Bring all of your meds, your BP cuff and your record of home blood pressures to your next appointment.  Exercise as you're able, try to walk approximately 30 minutes per day.  Keep salt intake to a minimum, especially watch canned and prepared boxed foods.  Eat more fresh fruits and vegetables and fewer canned items.  Avoid eating in fast food restaurants.    HOW TO TAKE YOUR BLOOD PRESSURE: . Rest 5 minutes before taking your blood pressure. .  Don't smoke or drink caffeinated beverages for at least 30 minutes before. . Take your blood pressure before (not after) you eat. . Sit comfortably with your back supported and both feet on the floor (don't cross your legs). . Elevate your arm to heart level on a table or a desk. . Use the proper sized cuff. It should fit smoothly and snugly around your bare upper arm. There should be enough room to slip a fingertip under the cuff. The bottom edge of the cuff should be 1 inch above the crease of the elbow. . Ideally, take 3 measurements at one sitting and record the average.

## 2018-01-04 NOTE — Progress Notes (Signed)
Patient ID: ZAMORIA BOSS                 DOB: 1980/09/12                      MRN: [id number]     HPI: Laurie Choi is a 37 y.o. female referred by Dr. Oval Linsey to HTN clinic. PMH includes uncontrolled hypertension, syncope, complex migraines,  moderate LVH and grade 2 diastolic dysfunction, and compliance issues. During most recent visit with Dr Oval Linsey her whole HTN regimen was changed and patient is currently on amlodipine 5mg  daily and chlorthalidone 25mg  daily.  She then saw Raquel Rodriguez-Guzman about a month later and was found to have BP well controlled at 110/72.  However she was feeling more fatigued, so the chlorthalidone was decreased to 12.5 mg daily.  She was to continue monitoring home reading and, work on exercise goals.  She is here today for follow up.    She is feeling well today, a bit stressed, as there was confusion with the appointment time and she was here 90 minutes early. She has been working hard on lifestyle modifications with our [place], and is seeing her today as well.  She reports feeling well, the only side effect she has noticed is a decreased libido.  At this point she is not willing to change medications, as she feels her pressure is finally coming under control.    Current HTN meds:  Amlodipine 5mg  daily Chlorthalidone 12.5mg  daily  Previously tried:  Labetalol 100mg  twice daily Doxazosin 4mg  daily Losartan  - stopped due to recall  BP goal: <130/80  Family History: family history includes Diabetes in her father; Heart attack in her maternal grandmother; Hypertension in her father, paternal grandfather, and paternal grandmother; Lupus (age of onset: 12) in her mother.   Social History: patient  reports that she has never smoked. She has never used smokeless tobacco. She reports that she does not drink alcohol or use drugs.   Diet: working on decrease sodium diet   Exercise: starting to use her stepper but feeling very  tired  Home BP readings: 23 readings since last visit, average 117/78 with range of 104-132/67-86.  Labs:  12/03/17:  Na 137, K 3.5, Glu 98, BUN 14, SCr 0.7  Wt Readings from Last 3 Encounters:  12/03/17 217 lb 3.2 oz (98.5 kg)  11/19/17 218 lb 9.6 oz (99.2 kg)  11/17/16 215 lb 6.4 oz (97.7 kg)   BP Readings from Last 3 Encounters:  12/03/17 110/72  11/19/17 (!) 166/96  11/17/16 (!) 155/107   Pulse Readings from Last 3 Encounters:  12/03/17 76  11/19/17 78  11/17/16 88    Past Medical History:  Diagnosis Date  . Benign essential HTN   . Hyperemesis gravidarum before end of [redacted] week gestation, dehydration     Current Outpatient Medications on File Prior to Visit  Medication Sig Dispense Refill  . amLODipine (NORVASC) 5 MG tablet Take 1 tablet (5 mg total) by mouth daily. 90 tablet 1  . chlorthalidone (HYGROTON) 25 MG tablet Take 0.5 tablets (12.5 mg total) by mouth daily. 30 tablet 0   No current facility-administered medications on file prior to visit.     Allergies  Allergen Reactions  . Tape Other (See Comments)    Medical redness, rash itching     There were no vitals taken for this visit.  Accelerated hypertension Patient doing much better with BP control on  current medications.  Will not make any changes today, although I did offer to decrease the amlodipine and increase chlorthalidone, as amlodipine is more likely to cause a decrease in libido.  She was not interested in that today, but knows to call should that become an issue for her.  She is scheduled to see Dr. Oval Linsey in December and we will follow up in January to be sure she is stable on medications.    Tommy Medal PharmD CPP [facility] [address] [place] [address] 01/04/2018 5:25 PM

## 2018-01-06 ENCOUNTER — Ambulatory Visit: Payer: BLUE CROSS/BLUE SHIELD

## 2018-01-20 ENCOUNTER — Ambulatory Visit: Payer: BLUE CROSS/BLUE SHIELD

## 2018-01-24 ENCOUNTER — Ambulatory Visit (INDEPENDENT_AMBULATORY_CARE_PROVIDER_SITE_OTHER): Payer: BLUE CROSS/BLUE SHIELD

## 2018-01-24 ENCOUNTER — Telehealth: Payer: Self-pay

## 2018-01-24 DIAGNOSIS — Z0000 Encounter for general adult medical examination without abnormal findings: Secondary | ICD-10-CM

## 2018-01-24 NOTE — Telephone Encounter (Signed)
Called to follow-up with concerns about visit. Pt was unavailable will try and call back.

## 2018-01-25 NOTE — Progress Notes (Signed)
Week: 5  Progress Notes: Pt mentioned that the sneakers that she purchased are uncomfortable but she know she should wear them for the stair stepper. Is currently recognizing that she is only eating about once a day. Discussed discomfort with change.   Opportunities: Would like to attempt the 5 minutes on the stair stepper. Would also like to spend some time each day cutting celery and other produce to snack on throughout the day. Has noticed through routine monitoring of bp, that her levels are really well.   Client Commitment/Agreement for Next Session: Pt agreed to explore personal feelings about change and come prepared to create a sustainability plan

## 2018-01-25 NOTE — Progress Notes (Signed)
Week: 4  Progress Notes: Pt discussed recent progress as it relates to her physical activity. Has purchased sneakers for the first time in a while to assist with using her stair stepper. Has expressed some concern about being physically active at home, as she doesn't want to pass out in front of her daughters with no one to help her.  Has passed out attempting physical activity in the past, and is in fear of doing it again.  Opportunities: Pt is continuing to make a conscious effort to reduce the amount of times per week that she is eating out. Is preparing more meals at home. Is also still tracking blood pressure, and daily tasks to help organize her day.    Client Commitment/Agreement for Next Session: Pt agrees to attempt 5 minutes of physical activity when husband is home for safety

## 2018-02-09 ENCOUNTER — Encounter: Payer: Self-pay | Admitting: Cardiovascular Disease

## 2018-02-09 ENCOUNTER — Ambulatory Visit: Payer: BLUE CROSS/BLUE SHIELD | Admitting: Cardiovascular Disease

## 2018-02-09 ENCOUNTER — Ambulatory Visit: Payer: BLUE CROSS/BLUE SHIELD

## 2018-02-09 VITALS — BP 116/84 | HR 77 | Ht 62.0 in | Wt 217.0 lb

## 2018-02-09 DIAGNOSIS — I10 Essential (primary) hypertension: Secondary | ICD-10-CM | POA: Diagnosis not present

## 2018-02-09 NOTE — Patient Instructions (Addendum)
Medication Instructions:  Your physician recommends that you continue on your current medications as directed. Please refer to the Current Medication list given to you today.  If you need a refill on your cardiac medications before your next appointment, please call your pharmacy.   Lab work: NONE  Testing/Procedures: NONE  Follow-Up: At [facility], you and your health needs are our priority.  As part of our continuing mission to provide you with exceptional heart care, we have created designated Provider Care Teams.  These Care Teams include your primary Cardiologist (physician) and Advanced Practice Providers (APPs -  Physician Assistants and Nurse Practitioners) who all work together to provide you with the care you need, when you need it. You will need a follow up appointment in 3-4 months.  You may see DR [facility]  or one of the following Advanced Practice Providers on your designated Care Team:   Kerin Ransom, PA-C Roby Lofts, [place] . Sande Rives, PA-C

## 2018-02-09 NOTE — Progress Notes (Signed)
Cardiology Office Note   Date:  02/09/2018   ID:  Laurie Choi, DOB 1980/12/04, MRN [id number]  PCP:  Bartholome Bill, MD  Cardiologist:   Skeet Latch, MD   No chief complaint on file.    History of Present Illness: Laurie Choi is a 37 y.o. female with hypertension, syncope, and complex migraines who presents for follow up.  Laurie Choi was seen by Laurie Choi 03/2016 after a syncopal episode and chest pain. Her chest pain was not exertional.  She had an echo 12/2015 that revealed LVEF 55 to 60% with moderate LVH and grade 2 diastolic dysfunction.  She was referred or a coronary calcium score but never had it.  She was diagnosed with hypertension several years ago.  Her BP has never been well-controlled.  She ran out of her medication one week ago but was unable to refill it because her former PCP no longer works in that office.  A new provider started her on labetalol.  Prior to that she was taking losartan but is afraid of the recalls on ARBs.  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    At her last appointment Laurie Choi's blood pressure was poorly-controlled.  Losartan and labetalol were discontinued.  She was started on chlorthalidone and amlodipine.  She followed up with our pharmacist the following month and her blood pressure was controlled.  However she was feeling fatigued.  Chlorthalidone was reduced to 12.5 mg.  She also followed up with our care guide and had a goal of working on meal prep.  She has stopped eating meat and only eats seafood.  She struggles with eating junk food.  She moved her stair stepper downstairs with plans to start using it but hasn't started yet.  She is sleeping better.  Overall she is feeling well.     Past Medical History:  Diagnosis Date  . Benign essential HTN   . Hyperemesis gravidarum before end of [redacted] week gestation, dehydration     Past Surgical History:  Procedure Laterality Date  . PICC LINE  PLACE PERIPHERAL ([place] HX)  2011   for rx and hydration 2nd hyperemesis     Current Outpatient Medications  Medication Sig Dispense Refill  . amLODipine (NORVASC) 5 MG tablet Take 1 tablet (5 mg total) by mouth daily. 90 tablet 1  . chlorthalidone (HYGROTON) 25 MG tablet Take 0.5 tablets (12.5 mg total) by mouth daily. 30 tablet 0   No current facility-administered medications for this visit.     Allergies:   Tape    Social History:  The patient  reports that she has never smoked. She has never used smokeless tobacco. She reports that she does not drink alcohol or use drugs.   Family History:  The patient's family history includes Diabetes in her father; Heart attack in her maternal grandmother; Hypertension in her father, paternal grandfather, and paternal grandmother; Lupus (age of onset: 9) in her mother.    ROS:  Please see the history of present illness.   Otherwise, review of systems are positive for none.   All other systems are reviewed and negative.    PHYSICAL EXAM: VS:  BP 116/84   Pulse 77   Ht 5\' 2"  (1.575 m)   Wt 217 lb (98.4 kg)   BMI 39.69 kg/m  , BMI Body mass index is 39.69 kg/m. GENERAL:  Well appearing HEENT: Pupils equal round and reactive, fundi not visualized, oral mucosa unremarkable  NECK:  No jugular venous distention, waveform within normal limits, carotid upstroke brisk and symmetric, no bruits, no thyromegaly LYMPHATICS:  No cervical adenopathy LUNGS:  Clear to auscultation bilaterally HEART:  RRR.  PMI not displaced or sustained,S1 and S2 within normal limits, no S3, no S4, no clicks, no rubs, no murmurs ABD:  Flat, positive bowel sounds normal in frequency in pitch, no bruits, no rebound, no guarding, no midline pulsatile mass, no hepatomegaly, no splenomegaly EXT:  2 plus pulses throughout, no edema, no cyanosis no clubbing SKIN:  No rashes no nodules NEURO:  Cranial nerves II through XII grossly intact, motor grossly intact throughout PSYCH:   Cognitively intact, oriented to person place and time    EKG:  EKG is not ordered today. The ekg ordered 11/19/17 demonstrates sinus rhythm.  Rate 78 bpm.    Echo 12/2015: Study Conclusions  - Left ventricle: The cavity size was normal. Wall thickness was   increased in a pattern of moderate LVH. Systolic function was   normal. The estimated ejection fraction was in the range of 55%   to 60%. Wall motion was normal; there were no regional wall   motion abnormalities. Features are consistent with a pseudonormal   left ventricular filling pattern, with concomitant abnormal   relaxation and increased filling pressure (grade 2 diastolic   dysfunction).  Will not bring her back   Recent Labs: 12/03/2017: ALT 17; BUN 14; Creatinine, Ser 0.70; Potassium 3.5; Sodium 137    Lipid Panel    Component Value Date/Time   CHOL 179 12/03/2017 1009   TRIG 57 12/03/2017 1009   HDL 54 12/03/2017 1009   CHOLHDL 3.3 12/03/2017 1009   CHOLHDL 3.9 12/20/2015 1108   VLDL 12 12/20/2015 1108   LDLCALC 114 (H) 12/03/2017 1009      Wt Readings from Last 3 Encounters:  02/09/18 217 lb (98.4 kg)  12/03/17 217 lb 3.2 oz (98.5 kg)  11/19/17 218 lb 9.6 oz (99.2 kg)      ASSESSMENT AND PLAN:  # Hypertension:  BP is now well-controlled.  Continue chlorthalidone and amlodipine.   # Morbid obesity: Laurie Choi is very motivated to make lifestyle changes and has started making some small changes.  She would like to continue meeting with our care guide.  I have encouraged her to start with just 5 minutes on the stairstepper every day.  She will also work on limiting the amount of junk food that comes into the house so that she will not be so enticed by it.  Weight loss goal is 1lb per week.  # Grade 2 diastolic dysfunction: Laurie Choi has diastolic dysfunction and LVH 2/2 poorly controlled hypertension.  She has no heart failure symptoms.  BP management as above.   Current medicines are reviewed at  length with the patient today.  The patient does not have concerns regarding medicines.  The following changes have been made:  None  Labs/ tests ordered today include:   No orders of the defined types were placed in this encounter.    Disposition:   FU with Lakeithia Rasor C. Oval Linsey, MD, [facility] in 3-4 months.    Signed, Kayela Humphres C. Oval Linsey, MD, [facility]  02/09/2018 9:12 AM    [place]

## 2018-02-16 NOTE — Progress Notes (Signed)
Week: 6  Progress Notes: Discussed with pt her advancements within the past year surrounding her BP levels. By self-monitoring, has noticed a significant change. Is now in normal range for the first time in 12 months. Is still having trouble engaging in the physical activity. Discussed her perceived barriers. Has started a pescatarian diet as well. Pt requested additional support beyond 6 sessions to get assistance with provider recommendation of losing 15 pounds.  Client Commitment/Agreement for Next Session: Next visit is 1/7 at 9:00. Is requesting information on yoga.

## 2018-03-08 ENCOUNTER — Ambulatory Visit: Payer: BLUE CROSS/BLUE SHIELD

## 2018-04-01 ENCOUNTER — Telehealth: Payer: Self-pay

## 2018-04-01 NOTE — Telephone Encounter (Signed)
Called pt to schedule final visit/ Pt wants to call back.

## 2018-04-08 ENCOUNTER — Ambulatory Visit: Payer: BLUE CROSS/BLUE SHIELD

## 2018-04-08 ENCOUNTER — Telehealth: Payer: Self-pay

## 2018-04-08 NOTE — Telephone Encounter (Signed)
Called to reschedule last cg visit. Left vm

## 2018-04-11 ENCOUNTER — Encounter: Payer: Self-pay | Admitting: Pharmacist

## 2018-04-11 ENCOUNTER — Ambulatory Visit (INDEPENDENT_AMBULATORY_CARE_PROVIDER_SITE_OTHER): Payer: BLUE CROSS/BLUE SHIELD | Admitting: Pharmacist

## 2018-04-11 VITALS — BP 118/82 | HR 72 | Ht 62.0 in

## 2018-04-11 DIAGNOSIS — I10 Essential (primary) hypertension: Secondary | ICD-10-CM

## 2018-04-11 LAB — BASIC METABOLIC PANEL
BUN/Creatinine Ratio: 15 (ref 9–23)
BUN: 10 mg/dL (ref 6–20)
CALCIUM: 9.1 mg/dL (ref 8.7–10.2)
CHLORIDE: 103 mmol/L (ref 96–106)
CO2: 23 mmol/L (ref 20–29)
Creatinine, Ser: 0.68 mg/dL (ref 0.57–1.00)
GFR calc Af Amer: 127 mL/min/{1.73_m2} (ref >59)
GFR, EST NON AFRICAN AMERICAN: 111 mL/min/{1.73_m2} (ref >59)
Glucose: 106 mg/dL — ABNORMAL HIGH (ref 65–99)
POTASSIUM: 3.7 mmol/L (ref 3.5–5.2)
Sodium: 139 mmol/L (ref 134–144)

## 2018-04-11 MED ORDER — AMLODIPINE BESYLATE 5 MG PO TABS: 5.0000 mg | ORAL_TABLET | Freq: Every day | ORAL | 1 refills | Status: DC

## 2018-04-11 MED ORDER — CHLORTHALIDONE 25 MG PO TABS: 12.5000 mg | ORAL_TABLET | Freq: Every day | ORAL | 1 refills | Status: DC

## 2018-04-11 NOTE — Assessment & Plan Note (Signed)
Bloor pressure remains at goal. Patient continues to work on positive  lifestyle modifications and medication compliance. Will continue all medication as previously prescribed and follow up with HTN clinic as needed. BMET repeat done today after few months taking chlorthalidone.

## 2018-04-11 NOTE — Progress Notes (Signed)
Patient ID: AOI KOUNS                 DOB: 1980/08/20                      MRN: [id number]     HPI: Laurie Choi is a 38 y.o. female referred by Dr. Oval Linsey to HTN clinic. PMH includes uncontrolled hypertension, syncope, complex migraines,  moderate LVH and grade 2 diastolic dysfunction, and compliance issues.  She is feeling well today and denies swelling, dizziness, headaches, or blurry vision. She has been working hard on lifestyle modifications with our [place], and is seeing her today as well. Patient admits to missing medication for 2 days few weeks ago.   Current HTN meds:  Amlodipine 5mg  daily Chlorthalidone 12.5mg  daily  Previously tried:  Labetalol 100mg  twice daily Doxazosin 4mg  daily Losartan  - stopped due to recall  BP goal: <130/80  Family History: family history includes Diabetes in her father; Heart attack in her maternal grandmother; Hypertension in her father, paternal grandfather, and paternal grandmother; Lupus (age of onset: 80) in her mother.   Social History: patient  reports that she has never smoked. She has never used smokeless tobacco. She reports that she does not drink alcohol or use drugs.   Diet: working on decrease sodium diet   Exercise: occasional long walks with her daughter  Home BP readings:  10 readings, average 124/80; HR 79-84bpm.  Labs:  12/03/17:  Na 137, K 3.5, Glu 98, BUN 14, SCr 0.7  Wt Readings from Last 3 Encounters:  02/09/18 217 lb (98.4 kg)  12/03/17 217 lb 3.2 oz (98.5 kg)  11/19/17 218 lb 9.6 oz (99.2 kg)   BP Readings from Last 3 Encounters:  04/11/18 118/82  02/09/18 116/84  12/03/17 110/72   Pulse Readings from Last 3 Encounters:  04/11/18 72  02/09/18 77  12/03/17 76    Past Medical History:  Diagnosis Date  . Benign essential HTN   . Hyperemesis gravidarum before end of [redacted] week gestation, dehydration     No current outpatient medications on file prior to visit.   No current  facility-administered medications on file prior to visit.     Allergies  Allergen Reactions  . Tape Other (See Comments)    Medical redness, rash itching     Blood pressure 118/82, pulse 72, height 5\' 2"  (1.575 m).  Accelerated hypertension Bloor pressure remains at goal. Patient continues to work on positive  lifestyle modifications and medication compliance. Will continue all medication as previously prescribed and follow up with HTN clinic as needed. BMET repeat done today after few months taking chlorthalidone.   Kasyn Rolph Rodriguez-Guzman PharmD, BCPS, [place] [id number] 04/11/2018 5:01 PM

## 2018-04-11 NOTE — Patient Instructions (Addendum)
Return for a follow up appointment in as needed  Go to the lab in [place] your blood pressure at home daily (if able) and keep record of the readings.  Take your BP meds as follows: *NO MEDICATION CHANGES*  Bring all of your meds, your BP cuff and your record of home blood pressures to your next appointment.  Exercise as you're able, try to walk approximately 30 minutes per day.  Keep salt intake to a minimum, especially watch canned and prepared boxed foods.  Eat more fresh fruits and vegetables and fewer canned items.  Avoid eating in fast food restaurants.    HOW TO TAKE YOUR BLOOD PRESSURE: . Rest 5 minutes before taking your blood pressure. .  Don't smoke or drink caffeinated beverages for at least 30 minutes before. . Take your blood pressure before (not after) you eat. . Sit comfortably with your back supported and both feet on the floor (don't cross your legs). . Elevate your arm to heart level on a table or a desk. . Use the proper sized cuff. It should fit smoothly and snugly around your bare upper arm. There should be enough room to slip a fingertip under the cuff. The bottom edge of the cuff should be 1 inch above the crease of the elbow. . Ideally, take 3 measurements at one sitting and record the average.

## 2018-04-26 DIAGNOSIS — R5383 Other fatigue: Secondary | ICD-10-CM | POA: Diagnosis not present

## 2018-04-26 DIAGNOSIS — Z01419 Encounter for gynecological examination (general) (routine) without abnormal findings: Secondary | ICD-10-CM | POA: Diagnosis not present

## 2018-04-26 DIAGNOSIS — Z6838 Body mass index (BMI) 38.0-38.9, adult: Secondary | ICD-10-CM | POA: Diagnosis not present

## 2018-04-26 DIAGNOSIS — F418 Other specified anxiety disorders: Secondary | ICD-10-CM | POA: Diagnosis not present

## 2018-05-20 ENCOUNTER — Telehealth: Payer: Self-pay | Admitting: *Deleted

## 2018-05-20 NOTE — Telephone Encounter (Signed)
Left message to call back regarding appointment 3/24

## 2018-05-23 ENCOUNTER — Other Ambulatory Visit: Payer: Self-pay | Admitting: Cardiovascular Disease

## 2018-05-23 NOTE — Telephone Encounter (Signed)
Follow Up::    Returning Laurie Choi's call from Friday, concerning her appt on 05-24-18.

## 2018-05-23 NOTE — Telephone Encounter (Signed)
PER PT IS FEELING FINE AND WANTS TO RESCHEDULE TOMORROW'S APPT  WILL FORWARD TO MELINDA TO ADDRESS .Adonis Housekeeper

## 2018-05-24 ENCOUNTER — Ambulatory Visit: Payer: BLUE CROSS/BLUE SHIELD | Admitting: Cardiovascular Disease

## 2018-05-25 NOTE — Telephone Encounter (Signed)
Will forward to pool to schedule follow up priority 2

## 2018-06-01 ENCOUNTER — Encounter: Payer: Self-pay | Admitting: *Deleted

## 2018-06-05 ENCOUNTER — Telehealth: Payer: Self-pay | Admitting: *Deleted

## 2018-06-05 NOTE — Telephone Encounter (Signed)
Left message to call back regarding video appointment.

## 2018-06-07 NOTE — Telephone Encounter (Signed)
Patient scheduled for video visit Friday, she has given verbal consent and aware her insurance will be billed. Sent consent via mychart

## 2018-06-09 ENCOUNTER — Telehealth: Payer: Self-pay | Admitting: Cardiovascular Disease

## 2018-06-09 NOTE — Telephone Encounter (Signed)
LVM to pre reg. 06-09-18 ST °

## 2018-06-10 ENCOUNTER — Telehealth (INDEPENDENT_AMBULATORY_CARE_PROVIDER_SITE_OTHER): Payer: BLUE CROSS/BLUE SHIELD | Admitting: Cardiovascular Disease

## 2018-06-10 ENCOUNTER — Telehealth: Payer: Self-pay | Admitting: Cardiovascular Disease

## 2018-06-10 DIAGNOSIS — F411 Generalized anxiety disorder: Secondary | ICD-10-CM | POA: Diagnosis not present

## 2018-06-10 DIAGNOSIS — I10 Essential (primary) hypertension: Secondary | ICD-10-CM

## 2018-06-10 DIAGNOSIS — I5032 Chronic diastolic (congestive) heart failure: Secondary | ICD-10-CM | POA: Diagnosis not present

## 2018-06-10 NOTE — Patient Instructions (Signed)
Medication Instructions:  Your physician recommends that you continue on your current medications as directed. Please refer to the Current Medication list given to you today.  If you need a refill on your cardiac medications before your next appointment, please call your pharmacy.   Lab work: NONE  Testing/Procedures: NONE  Follow-Up: At [facility], you and your health needs are our priority.  As part of our continuing mission to provide you with exceptional heart care, we have created designated Provider Care Teams.  These Care Teams include your primary Cardiologist (physician) and Advanced Practice Providers (APPs -  Physician Assistants and Nurse Practitioners) who all work together to provide you with the care you need, when you need it. You will need a follow up appointment in 6 months.  Please call our office 2 months in advance to schedule this appointment.  You may see Skeet Latch, MD or one of the following Advanced Practice Providers on your designated Care Team:   Kerin Ransom, PA-C Roby Lofts, [place] . Sande Rives, PA-C  Any Other Special Instructions Will Be Listed Below (If Applicable). CONTINUE TO WORK ON DIET AND EXERCISE

## 2018-06-10 NOTE — Telephone Encounter (Signed)
Smart phone/MyChart/pre reg complete. 06-10-18 ST °

## 2018-06-10 NOTE — Progress Notes (Signed)
Virtual Visit via Video Note   This visit type was conducted due to national recommendations for restrictions regarding the COVID-19 Pandemic (e.g. social distancing) in an effort to limit this patient's exposure and mitigate transmission in our community.  Due to her co-morbid illnesses, this patient is at least at moderate risk for complications without adequate follow up.  This format is felt to be most appropriate for this patient at this time.  All issues noted in this document were discussed and addressed.  A limited physical exam was performed with this format.  Please refer to the patient's chart for her consent to telehealth for [facility].   Evaluation Performed:  Follow-up visit  Date:  06/10/2018   ID:  Laurie Choi, DOB 06/01/1980, MRN [id number]  Patient Location: Home  Provider Location: Office  PCP:  Bartholome Bill, MD  Cardiologist:  Skeet Latch, MD  Electrophysiologist:  None   Chief Complaint:  hypertension  History of Present Illness:    Laurie Choi is a 38 y.o. female who presents via audio/video conferencing for a telehealth visit today.    Laurie Choi is a 38 y.o. female with hypertension, syncope, and complex migraines who presents for follow up.  Laurie Choi was seen by [name] 03/2016 after a syncopal episode and chest pain. Her chest pain was not exertional.  She had an echo 12/2015 that revealed LVEF 55 to 60% with moderate LVH and grade 2 diastolic dysfunction.  She was referred or a coronary calcium score but never had it.  She was diagnosed with hypertension several years ago and her BP was never well-controlled.  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    Laurie Choi was started on chlorthalidone and amlodipine.  She followed up with our pharmacist the following month and her blood pressure was controlled.  However she was feeling fatigued.  Chlorthalidone was reduced to 12.5 mg.  She also  followed up with our care guide and has been working on her diet and exercise.  Her blood pressure continues to be well-controlled.  It is consistently less than then 130/80.  She notes that she has not been sleeping well.  She has not had any lightheadedness or dizziness lately.  She also denies any palpitations or chest pain.  She has not been exercising as much as she hoped to in hopes that she is going to get on a better path soon.  She notes that she does snore and sometimes wakes herself up at night.  She thinks she has sleep apnea but wants to work on losing weight before she gets tested.   The patient does not have symptoms concerning for COVID-19 infection (fever, chills, cough, or new shortness of breath).    Past Medical History:  Diagnosis Date  . Benign essential HTN   . Hyperemesis gravidarum before end of [redacted] week gestation, dehydration    Past Surgical History:  Procedure Laterality Date  . PICC LINE PLACE PERIPHERAL ([place] HX)  2011   for rx and hydration 2nd hyperemesis     Current Meds  Medication Sig  . amLODipine (NORVASC) 5 MG tablet TAKE 1 TABLET BY MOUTH DAILY  . chlorthalidone (HYGROTON) 25 MG tablet Take 0.5 tablets (12.5 mg total) by mouth daily.     Allergies:   Tape   Social History   Tobacco Use  . Smoking status: Never Smoker  . Smokeless tobacco: Never Used  Substance Use Topics  .  Alcohol use: No  . Drug use: No     Family Hx: The patient's family history includes Diabetes in her father; Heart attack in her maternal grandmother; Hypertension in her father, paternal grandfather, and paternal grandmother; Lupus (age of onset: 55) in her mother.  ROS:   Please see the history of present illness.     All other systems reviewed and are negative.   Prior CV studies:   The following studies were reviewed today:  Echo 12/2015: Study Conclusions  - Left ventricle: The cavity size was normal. Wall thickness was increased in a pattern of  moderate LVH. Systolic function was normal. The estimated ejection fraction was in the range of 55% to 60%. Wall motion was normal; there were no regional wall motion abnormalities. Features are consistent with a pseudonormal left ventricular filling pattern, with concomitant abnormal relaxation and increased filling pressure (grade 2 diastolic dysfunction).  Will not bring her back   Labs/Other Tests and Data Reviewed:    EKG:  No ECG reviewed.  Recent Labs: 12/03/2017: ALT 17 04/11/2018: BUN 10; Creatinine, Ser 0.68; Potassium 3.7; Sodium 139   Recent Lipid Panel Lab Results  Component Value Date/Time   CHOL 179 12/03/2017 10:09 AM   TRIG 57 12/03/2017 10:09 AM   HDL 54 12/03/2017 10:09 AM   CHOLHDL 3.3 12/03/2017 10:09 AM   CHOLHDL 3.9 12/20/2015 11:08 AM   LDLCALC 114 (H) 12/03/2017 10:09 AM    Wt Readings from Last 3 Encounters:  02/09/18 217 lb (98.4 kg)  12/03/17 217 lb 3.2 oz (98.5 kg)  11/19/17 218 lb 9.6 oz (99.2 kg)     Objective:    There were no vitals taken for this visit. GENERAL: Well-appearing.  No acute distress. HEENT: Pupils equal round.  Oral mucosa unremarkable NECK:  No jugular venous distention, no visible thyromegaly EXT:  No edema, no cyanosis no clubbing SKIN:  No rashes no nodules NEURO:  Speech fluent.  Cranial nerves grossly intact.  Moves all 4 extremities freely PSYCH:  Cognitively intact, oriented to person place and time   ASSESSMENT & PLAN:    # Hypertension:   Blood pressure remains well have been controlled on amlodipine and chlorthalidone.  No changes.  She will continue to work on diet and exercise.  # Morbid obesity: Laurie Choi continues to struggle with her lifestyle interventions.  She will keep working on this.  She is very motivated and has a supportive family.  It sounds as though she likely has sleep apnea.  She wants to give it 6 months before getting a sleep study.  # Grade 2 diastolic dysfunction:  Laurie Choi has diastolic dysfunction and LVH 2/2 poorly controlled hypertension.  She has no heart failure symptoms.  BP management as above.   COVID-19 Education: The signs and symptoms of COVID-19 were discussed with the patient and how to seek care for testing (follow up with PCP or arrange E-visit).  The importance of social distancing was discussed today.  Time:   Today, I have spent 17 minutes with the patient with telehealth technology discussing the above problems.     Medication Adjustments/Labs and Tests Ordered: Current medicines are reviewed at length with the patient today.  Concerns regarding medicines are outlined above.  Tests Ordered: No orders of the defined types were placed in this encounter.  Medication Changes: No orders of the defined types were placed in this encounter.   Disposition:  Follow up in 6 month(s)  Signed, Skeet Latch, MD  06/10/2018 2:55 PM    [facility]

## 2018-09-19 ENCOUNTER — Other Ambulatory Visit: Payer: Self-pay | Admitting: Cardiovascular Disease

## 2019-03-15 ENCOUNTER — Ambulatory Visit: Payer: BLUE CROSS/BLUE SHIELD | Admitting: Cardiovascular Disease

## 2019-04-13 DIAGNOSIS — N943 Premenstrual tension syndrome: Secondary | ICD-10-CM | POA: Diagnosis not present

## 2019-04-13 DIAGNOSIS — N946 Dysmenorrhea, unspecified: Secondary | ICD-10-CM | POA: Diagnosis not present

## 2019-04-13 DIAGNOSIS — R102 Pelvic and perineal pain: Secondary | ICD-10-CM | POA: Diagnosis not present

## 2019-04-25 ENCOUNTER — Telehealth: Payer: Self-pay | Admitting: Cardiovascular Disease

## 2019-04-25 NOTE — Telephone Encounter (Signed)
Responded to patient Mychart message.

## 2019-04-25 NOTE — Telephone Encounter (Signed)
Okay to take duloxetine 10mg  as prescribed. No significant interactions noted with your current therapy.   Recommend to monitor your blood pressure 2-3 times per week for 2 weeks. It may decrease further while using duloxetine.

## 2019-04-25 NOTE — Telephone Encounter (Signed)
New Message    Pt c/o medication issue:  1. Name of Medication: Duloxetine 10 mg   2. How are you currently taking this medication (dosage and times per day)? Pt said she took her 2nd dose yesterday   3. Are you having a reaction (difficulty breathing--STAT)? No   4. What is your medication issue? Pt is wondering if this medication is okay for her to take. She says it was prescribed by her OBGYN. Pt states she took this medication for the 2nd time yesterday. She has experienced some insomnia and anxiety    Please call

## 2019-05-03 DIAGNOSIS — N921 Excessive and frequent menstruation with irregular cycle: Secondary | ICD-10-CM | POA: Diagnosis not present

## 2019-05-03 DIAGNOSIS — Z1239 Encounter for other screening for malignant neoplasm of breast: Secondary | ICD-10-CM | POA: Diagnosis not present

## 2019-05-03 DIAGNOSIS — Z124 Encounter for screening for malignant neoplasm of cervix: Secondary | ICD-10-CM | POA: Diagnosis not present

## 2019-05-03 DIAGNOSIS — Z01419 Encounter for gynecological examination (general) (routine) without abnormal findings: Secondary | ICD-10-CM | POA: Diagnosis not present

## 2019-05-05 IMAGING — DX DG CHEST 2V
2 series · 2 of 2 positions shown · non-contrast
Comparison: Chest radiograph March 19, 2016

CLINICAL DATA: Chest pain radiating to back for 1 week.

EXAM:
CHEST  2 VIEW

[chest pa]
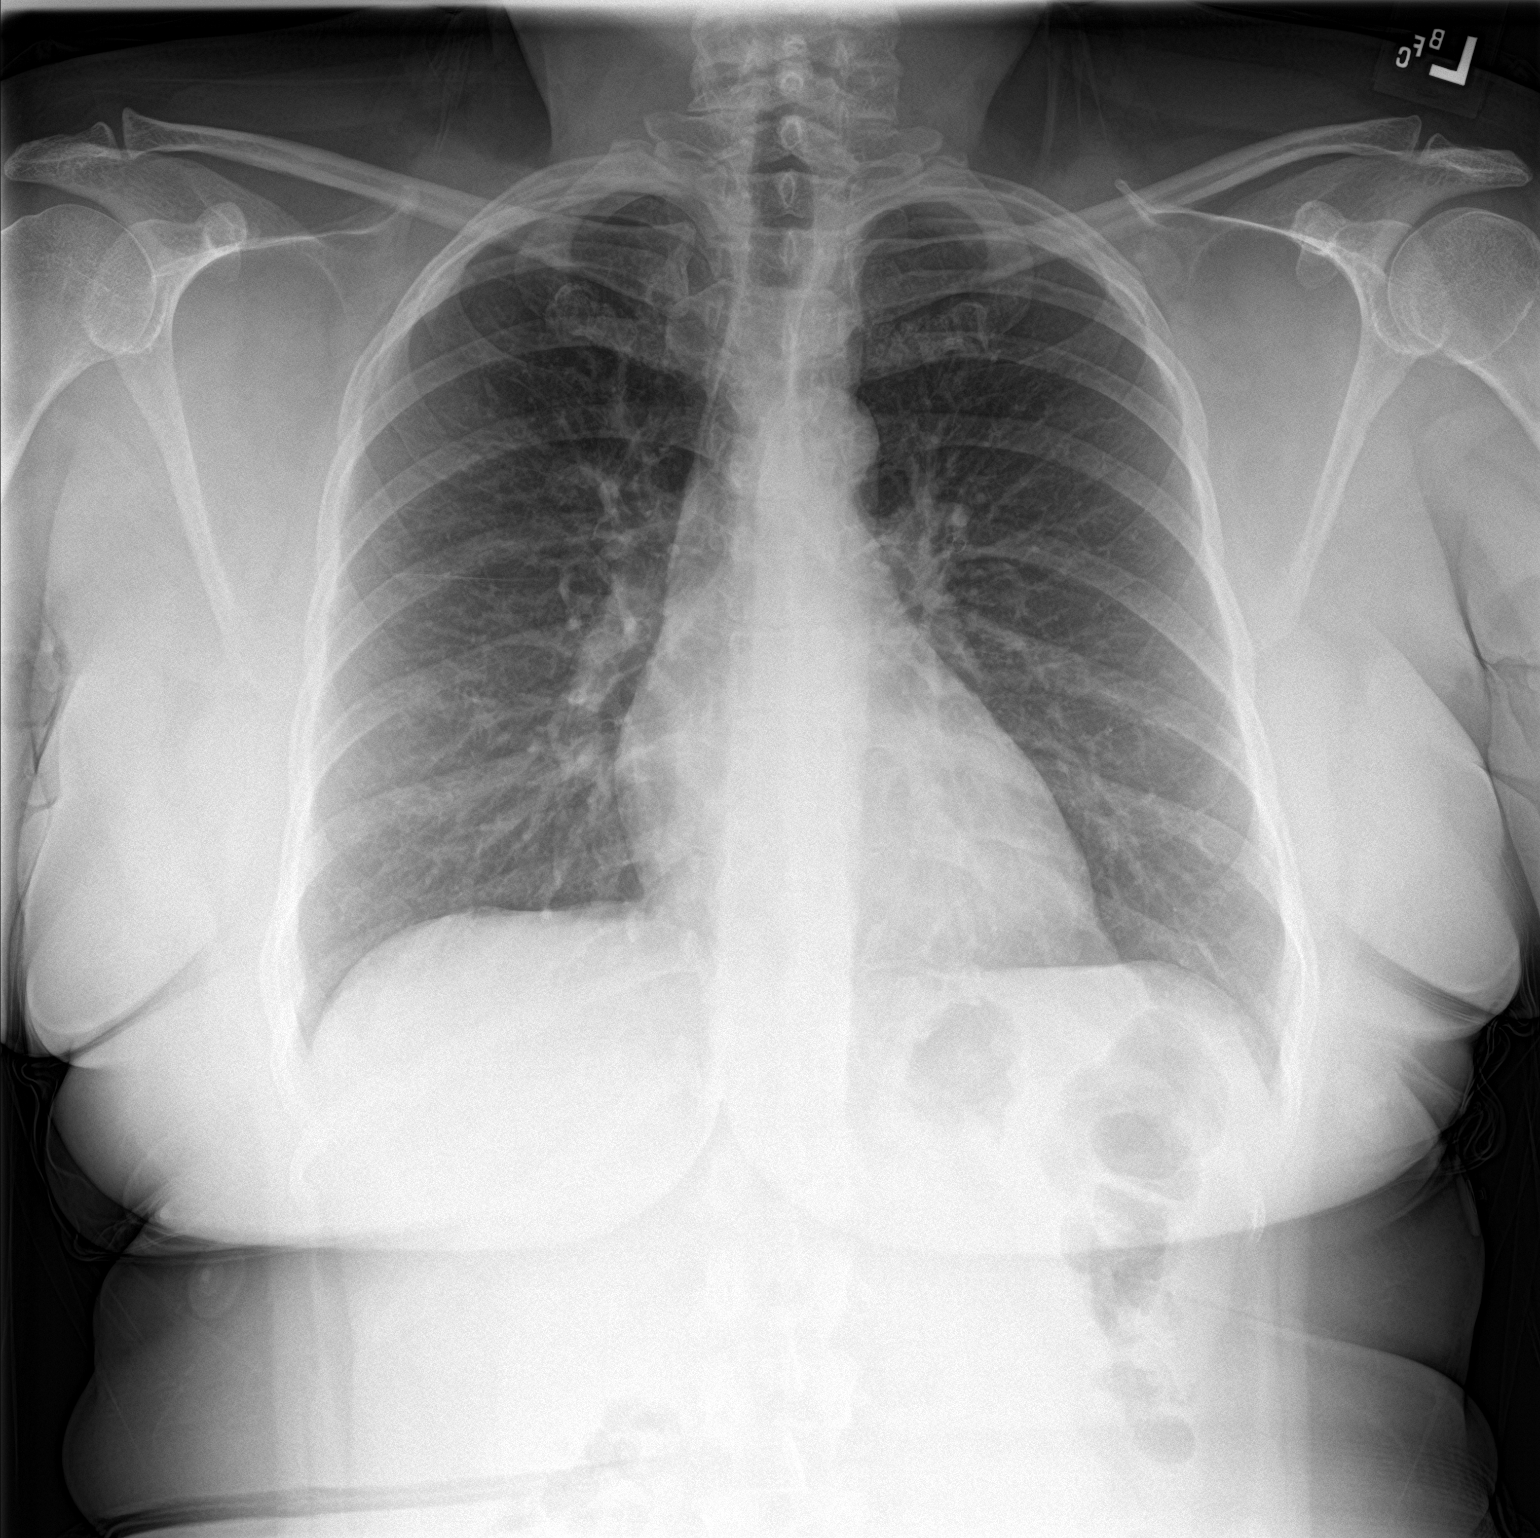

[chest lat]
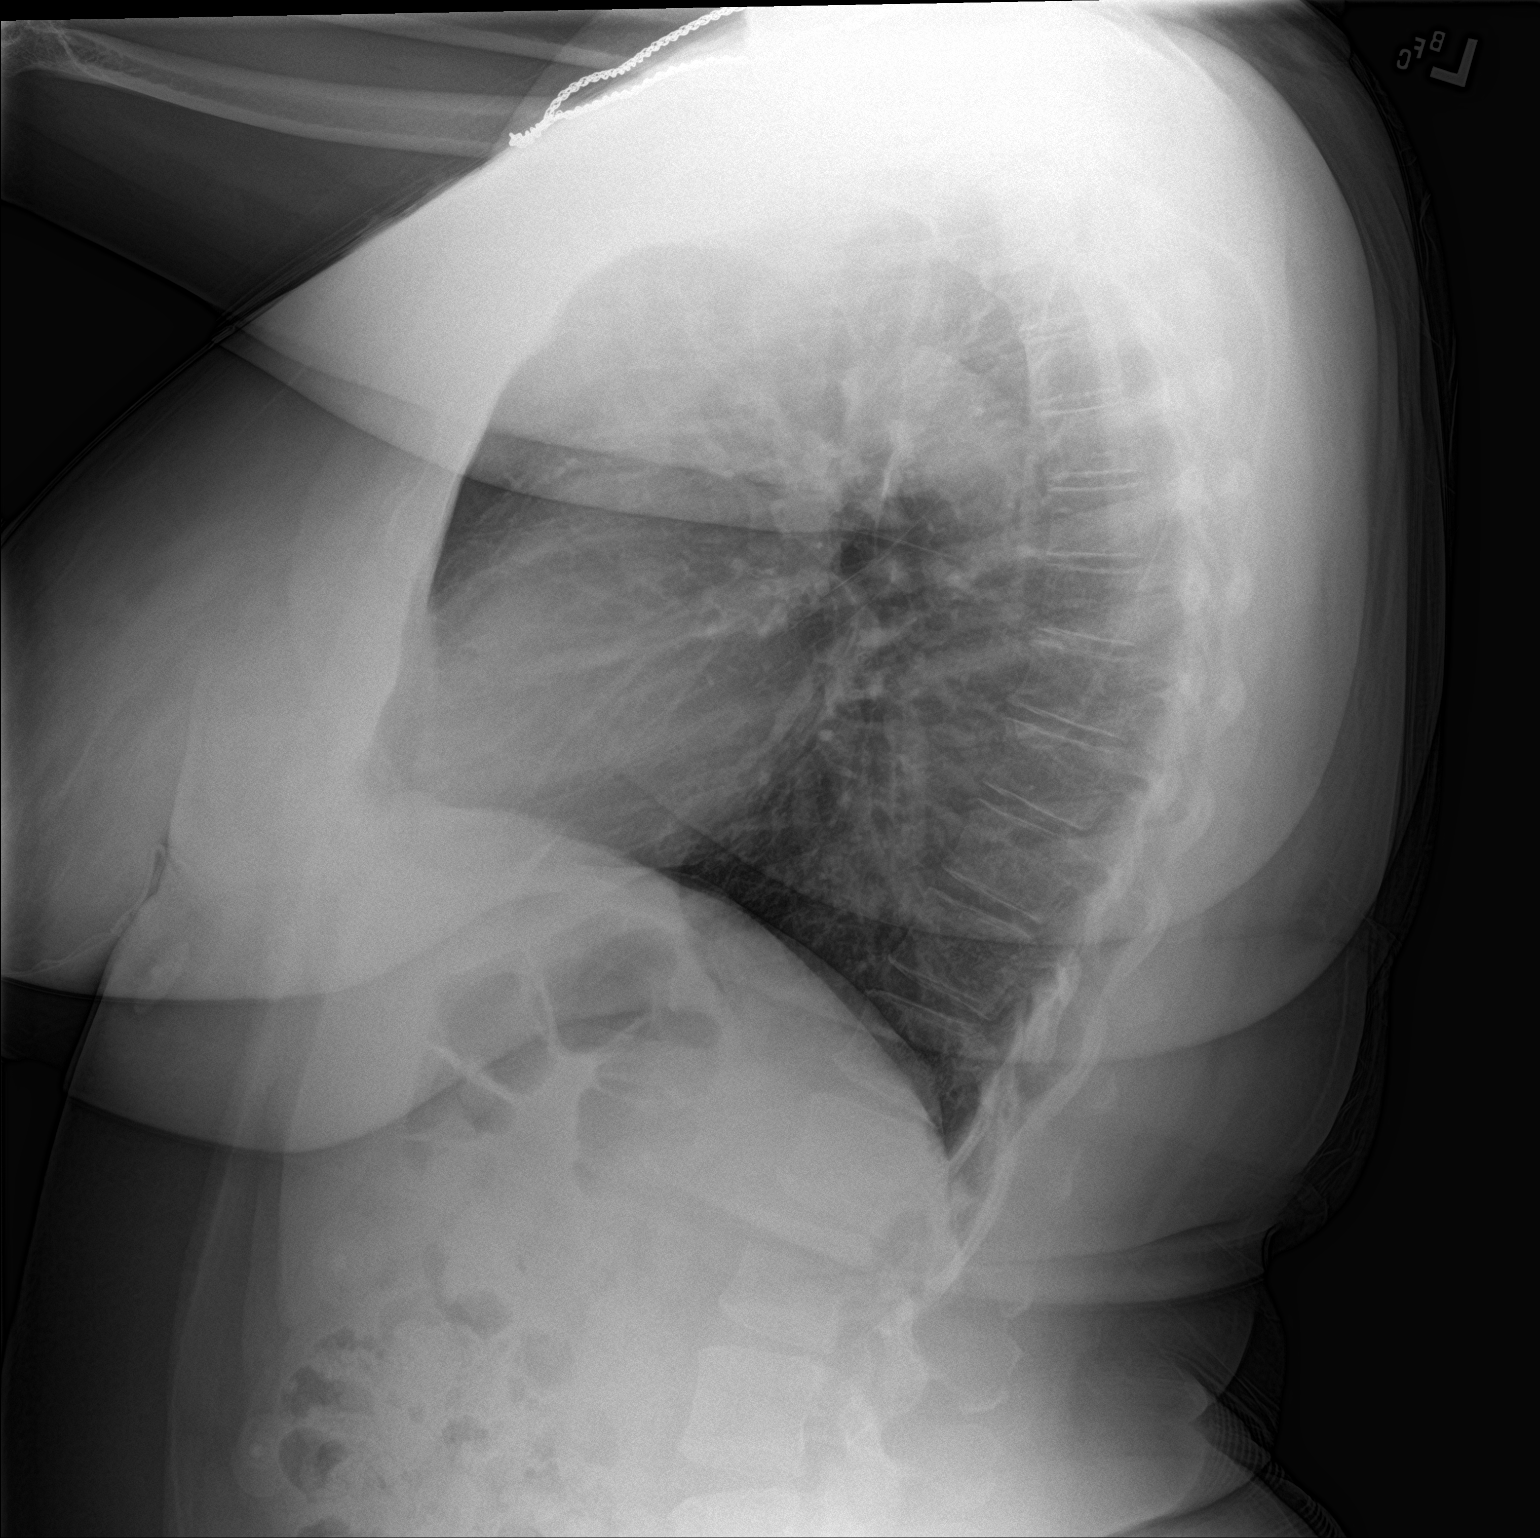

[2 of 2 positions shown; findings below may reference images not displayed]

FINDINGS: Cardiomediastinal silhouette is normal. No pleural effusions or
focal consolidations. Trachea projects midline and there is no
pneumothorax. Soft tissue planes and included osseous structures are
non-suspicious.
IMPRESSION: Normal chest.

## 2019-06-14 DIAGNOSIS — N939 Abnormal uterine and vaginal bleeding, unspecified: Secondary | ICD-10-CM | POA: Diagnosis not present

## 2019-06-14 DIAGNOSIS — N926 Irregular menstruation, unspecified: Secondary | ICD-10-CM | POA: Diagnosis not present

## 2019-09-18 ENCOUNTER — Other Ambulatory Visit: Payer: BLUE CROSS/BLUE SHIELD

## 2019-10-04 ENCOUNTER — Ambulatory Visit: Payer: BLUE CROSS/BLUE SHIELD | Admitting: Cardiovascular Disease

## 2019-10-04 ENCOUNTER — Encounter (INDEPENDENT_AMBULATORY_CARE_PROVIDER_SITE_OTHER): Payer: Self-pay

## 2019-10-04 ENCOUNTER — Other Ambulatory Visit: Payer: Self-pay

## 2019-10-04 ENCOUNTER — Encounter: Payer: Self-pay | Admitting: Cardiovascular Disease

## 2019-10-04 DIAGNOSIS — I5032 Chronic diastolic (congestive) heart failure: Secondary | ICD-10-CM

## 2019-10-04 DIAGNOSIS — I10 Essential (primary) hypertension: Secondary | ICD-10-CM

## 2019-10-04 NOTE — Patient Instructions (Addendum)
Medication Instructions:  Your physician recommends that you continue on your current medications as directed. Please refer to the Current Medication list given to you today.   Labwork: NONE  Testing/Procedures: NONE  Follow-Up: AS NEEDED   Any Other Special Instructions Will Be Listed Below (If Applicable).  YOU HAVE BEEN REFERRED TO    Where: [facility] Address: [address] [place] [address] Phone: [phone]  IF YOU HAVE NOT HEARD FROM THEM BY THE END OF NEXT WEEK YOU CAN CALL THEM DIRECTLY AT THE NUMBER ABOVE

## 2019-10-04 NOTE — Progress Notes (Signed)
Cardiology Office Note   Evaluation Performed:  Follow-up visit  Date:  10/04/2019   ID:  Laurie Choi, DOB 25-Apr-1980, MRN [id number]  PCP:  Bartholome Bill, MD  Cardiologist:  Skeet Latch, MD  Electrophysiologist:  None   Chief Complaint:  hypertension  History of Present Illness:    Laurie Choi is a 39 y.o. female with hypertension, syncope, and complex migraines who presents for follow up.  Laurie Choi was seen by Laurie Choi 03/2016 after a syncopal episode and chest pain. Her chest pain was not exertional.  She had an echo 12/2015 that revealed LVEF 55 to 60% with moderate LVH and grade 2 diastolic dysfunction.  She was referred or a coronary calcium score but never had it.  She was diagnosed with hypertension several years ago and her BP was never well-controlled.   Laurie Choi was started on chlorthalidone and amlodipine.  She followed up with our pharmacist the following month and her blood pressure was controlled.  However she was feeling fatigued.  Chlorthalidone was reduced to 12.5 mg.  Since her last appointment she has been feeling better.  Her breathing has improved.  She started taking fluoxetine and her mood has improved.  She also started taking melatonin and is sleeping better.  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  They are 9 and 10.  She starts a new job at [employer] in August and is excited about that.  She plans to start going to the gym when she drops her kids off in the morning.   She is frustrated that she continues to gain weight.  She tries to cook healthy meals and does this for the family but then finds herself eating 3 bowls of apple jacks at night.  She has no exertional chest pain or shortness of breath.  She denies lower extremity edema, orthopnea, or PND.   Past Medical History:  Diagnosis Date  . Benign essential HTN   . Hyperemesis gravidarum before end of [redacted] week gestation, dehydration    Past  Surgical History:  Procedure Laterality Date  . PICC LINE PLACE PERIPHERAL ([place] HX)  2011   for rx and hydration 2nd hyperemesis     Current Meds  Medication Sig  . amLODipine (NORVASC) 5 MG tablet TAKE 1 TABLET BY MOUTH DAILY  . chlorthalidone (HYGROTON) 25 MG tablet TAKE 1 TABLET(25 MG) BY MOUTH DAILY  . FLUoxetine (PROZAC) 10 MG capsule Take 10 mg by mouth daily.  . Melaton-Thean-Cham-PassF-LBalm (MELATONIN + L-THEANINE) CAPS Take 10 mg by mouth.  . [DISCONTINUED] amLODipine (NORVASC) 5 MG tablet TK 1 T PO D  . [DISCONTINUED] chlorthalidone (HYGROTON) 25 MG tablet Take by mouth.     Allergies:   Tape   Social History   Tobacco Use  . Smoking status: Never Smoker  . Smokeless tobacco: Never Used  Substance Use Topics  . Alcohol use: No  . Drug use: No     Family Hx: The patient's family history includes Diabetes in her father; Heart attack in her maternal grandmother; Hypertension in her father, paternal grandfather, and paternal grandmother; Lupus (age of onset: 62) in her mother.  ROS:   Please see the history of present illness.     All other systems reviewed and are negative.  Prior CV studies:   The following studies were reviewed today:  Echo 12/2015: Study Conclusions  - Left ventricle: The cavity size was normal. Wall thickness was increased in  a pattern of moderate LVH. Systolic function was normal. The estimated ejection fraction was in the range of 55% to 60%. Wall motion was normal; there were no regional wall motion abnormalities. Features are consistent with a pseudonormal left ventricular filling pattern, with concomitant abnormal relaxation and increased filling pressure (grade 2 diastolic dysfunction).  Will not bring her back   Labs/Other Tests and Data Reviewed:    EKG:   10/04/19: Sinus rhythm.  Rate 79 bpm. Nonspecific T wave abnormalities.  Recent Labs: No results found for requested labs within last 8760 hours.    Recent Lipid Panel Lab Results  Component Value Date/Time   CHOL 179 12/03/2017 10:09 AM   TRIG 57 12/03/2017 10:09 AM   HDL 54 12/03/2017 10:09 AM   CHOLHDL 3.3 12/03/2017 10:09 AM   CHOLHDL 3.9 12/20/2015 11:08 AM   LDLCALC 114 (H) 12/03/2017 10:09 AM    Wt Readings from Last 3 Encounters:  10/04/19 225 lb 9.6 oz (102.3 kg)  02/09/18 217 lb (98.4 kg)  12/03/17 217 lb 3.2 oz (98.5 kg)     Objective:    VS:  BP 115/75   Pulse 79   Temp (!) 97.2 F (36.2 C)   Ht 5\' 2"  (1.575 m)   Wt 225 lb 9.6 oz (102.3 kg)   SpO2 97%   BMI 41.26 kg/m  , BMI Body mass index is 41.26 kg/m. GENERAL:  Well appearing HEENT: Pupils equal round and reactive, fundi not visualized, oral mucosa unremarkable NECK:  No jugular venous distention, waveform within normal limits, carotid upstroke brisk and symmetric, no bruits LUNGS:  Clear to auscultation bilaterally HEART:  RRR.  PMI not displaced or sustained,S1 and S2 within normal limits, no S3, no S4, no clicks, no rubs, no murmurs ABD:  Flat, positive bowel sounds normal in frequency in pitch, no bruits, no rebound, no guarding, no midline pulsatile mass, no hepatomegaly, no splenomegaly EXT:  2 plus pulses throughout, no edema, no cyanosis no clubbing SKIN:  No rashes no nodules NEURO:  Cranial nerves II through XII grossly intact, motor grossly intact throughout PSYCH:  Cognitively intact, oriented to person place and time   ASSESSMENT & PLAN:    # Hypertension:  Blood pressure remains well have been controlled on amlodipine and chlorthalidone.  No changes.  She will continue to work on diet and exercise.  # Morbid obesity: Laurie Choi continues to struggle with her lifestyle interventions. Referral to [facility].  # Grade 2 diastolic dysfunction: Laurie Choi has diastolic dysfunction and LVH 2/2 poorly controlled hypertension.  She has no heart failure symptoms.  BP management as above.    Medication  Adjustments/Labs and Tests Ordered: Current medicines are reviewed at length with the patient today.  Concerns regarding medicines are outlined above.   Tests Ordered: Orders Placed This Encounter  Procedures  . Ambulatory referral to [facility]  . EKG 12-Lead   Medication Changes: No orders of the defined types were placed in this encounter.   Disposition:  Follow up as needed  Signed, Skeet Latch, MD  10/04/2019 12:52 PM    [facility]

## 2019-10-12 ENCOUNTER — Encounter (INDEPENDENT_AMBULATORY_CARE_PROVIDER_SITE_OTHER): Payer: Self-pay | Admitting: Family Medicine

## 2019-10-12 ENCOUNTER — Other Ambulatory Visit: Payer: Self-pay

## 2019-10-12 ENCOUNTER — Ambulatory Visit (INDEPENDENT_AMBULATORY_CARE_PROVIDER_SITE_OTHER): Payer: BC Managed Care – PPO | Admitting: Family Medicine

## 2019-10-12 VITALS — BP 120/82 | HR 83 | Temp 98.0°F | Ht 63.0 in | Wt 219.0 lb

## 2019-10-12 DIAGNOSIS — R5383 Other fatigue: Secondary | ICD-10-CM | POA: Diagnosis not present

## 2019-10-12 DIAGNOSIS — F3289 Other specified depressive episodes: Secondary | ICD-10-CM | POA: Diagnosis not present

## 2019-10-12 DIAGNOSIS — I5030 Unspecified diastolic (congestive) heart failure: Secondary | ICD-10-CM

## 2019-10-12 DIAGNOSIS — I10 Essential (primary) hypertension: Secondary | ICD-10-CM | POA: Diagnosis not present

## 2019-10-12 DIAGNOSIS — R0602 Shortness of breath: Secondary | ICD-10-CM

## 2019-10-12 DIAGNOSIS — Z9189 Other specified personal risk factors, not elsewhere classified: Secondary | ICD-10-CM

## 2019-10-12 DIAGNOSIS — Z0289 Encounter for other administrative examinations: Secondary | ICD-10-CM

## 2019-10-12 DIAGNOSIS — G479 Sleep disorder, unspecified: Secondary | ICD-10-CM

## 2019-10-12 DIAGNOSIS — R7301 Impaired fasting glucose: Secondary | ICD-10-CM

## 2019-10-12 DIAGNOSIS — Z6838 Body mass index (BMI) 38.0-38.9, adult: Secondary | ICD-10-CM

## 2019-10-12 NOTE — Progress Notes (Signed)
Dear Dr. Oval Linsey,   Thank you for referring Laurie Choi to our clinic. The following note includes my evaluation and treatment recommendations.    Chief Complaint:   OBESITY Laurie Choi (MR# [id number]) is a 39 y.o. female who presents for evaluation and treatment of obesity and related comorbidities. Current BMI is Body mass index is 38.79 kg/m. Laurie Choi has been struggling with her weight for many years and has been unsuccessful in either losing weight, maintaining weight loss, or reaching her healthy weight goal.  [name] is currently in the action stage of change and ready to dedicate time achieving and maintaining a healthier weight. Laurie Choi is interested in becoming our patient and working on intensive lifestyle modifications including (but not limited to) diet and exercise for weight loss.  Laurie Choi's habits were reviewed today and are as follows: Her family eats meals together, she thinks her family will eat healthier with her, her desired weight loss is 106 pounds, she started gaining weight at around ate 31, her heaviest weight ever was 226 pounds, she craves cereal, she snacks frequently in the evenings, she skips breakfast and dinner frequently, she is frequently drinking liquids with calories, she frequently makes poor food choices, she frequently eats larger portions than normal and she struggles with emotional eating.  Depression Screen Laurie Choi (modified PHQ-9) score was 24.  Depression screen [facility] 2/9 10/12/2019  Decreased Interest 3  Down, Depressed, Hopeless 3  PHQ - 2 Score 6  Altered sleeping 3  Tired, decreased energy 3  Change in appetite 3  Feeling bad or failure about yourself  3  Trouble concentrating 3  Moving slowly or fidgety/restless 0  Suicidal thoughts 3  PHQ-9 Score 24  Difficult doing work/chores Somewhat difficult   Subjective:   1. SOB (shortness of breath) on exertion Laurie Choi notes increasing shortness  of breath with exercising and seems to be worsening over time with weight gain. She notes getting out of breath sooner with activity than she used to. This has gotten worse recently. Laurie Choi denies shortness of breath at rest or orthopnea.  2. Other fatigue Laurie Choi admits to daytime somnolence and reports waking up still tired. Laurie Choi has a history of symptoms of daytime fatigue, morning fatigue and snoring. Laurie Choi generally gets 6 hours of sleep per night, and states that she has poor quality sleeping. Snoring is present. Apneic episodes are not present. Epworth Sleepiness Score is 8.  3. Essential hypertension Review: taking medications as instructed, no medication side effects noted, no chest pain on exertion, no dyspnea on exertion, no swelling of ankles.  She says that for 8 years or so, she has had problems with blood pressure.  She is on amlodipine and chlorthalidone per Cardiology.  BP Readings from Last 3 Encounters:  10/12/19 120/82  10/04/19 115/75  04/11/18 118/82   4. Diastolic congestive heart failure, NYHA class 2, congestive heart failure chronicity (HCC) Initiallly occurred secondary to poorly controlled blood pressure.  Echo in 04/2015 showed LVEF of 55-60% with moderate LVH and grade 2 diastolic dysfunction.  She is treated by Dr. Oval Linsey..  5. Trouble in sleeping Positive for frequent awakenings or difficulty falling asleep.  Melatonin 10 mg at bedtime is working well.  6. Elevated fasting blood sugar Laurie Choi has a history of some elevated blood glucose readings without a diagnosis of diabetes. She denies polyphagia.  7. Other depression with emotional eating Positive for postpartum depression in the past.  She has been on Prozac  since 06/2019.  She feels this is related to poor sleep, which has improved with 10 mg of melatonin nightly, which she has taken for the past couple of weeks.  8. At risk for diabetes mellitus Laurie Choi is at higher than average risk for  developing diabetes due to her obesity and history of elevated blood sugar.   Assessment/Plan:   1. SOB (shortness of breath) on exertion [name] does feel that she gets out of breath more easily that she used to when she exercises. Laurie Choi shortness of breath appears to be obesity related and exercise induced. She has agreed to work on weight loss and gradually increase exercise to treat her exercise induced shortness of breath. Will continue to monitor closely. - Lipid panel  2. Other fatigue Laurie Choi does feel that her weight is causing her energy to be lower than it should be. Fatigue may be related to obesity, depression or many other causes. Labs will be ordered, and in the meanwhile, Laurie Choi will focus on self care including making healthy food choices, increasing physical activity and focusing on stress reduction. - Vitamin B12 - CBC with Differential/Platelet - Folate - T3 - T4 - TSH - VITAMIN D 25 Hydroxy (Vit-D Deficiency, Fractures)  3. Essential hypertension Laurie Choi is working on healthy weight loss and exercise to improve blood pressure control. We will watch for signs of hypotension as she continues her lifestyle modifications.  Blood pressure is at goal today. - Comprehensive metabolic panel  4. Diastolic congestive heart failure, NYHA class 2, congestive heart failure chronicity ([place]) Treatment plan per Cardiology.  Laurie Choi nutritional plan and weight loss.  5. Trouble in sleeping Symptoms are stable.  No concerns currently.  She declines the need for additional help at this time.  6. Elevated fasting blood sugar Fasting labs will be obtained and results with be discussed with Laurie Choi in 2 weeks at her follow up visit. In the meanwhile Laurie Choi was started on a lower simple carbohydrate diet and will work on weight loss efforts.  Will check labs today. - Hemoglobin A1c - Insulin, random  7. Other depression with emotional eating Continue Prozac.  Consider Laurie Choi  referral in the future.  Laurie Choi nutritional plan, weight loss.  8. At risk for diabetes mellitus Laurie Choi was given approximately 15 minutes of diabetes education and counseling today. We discussed intensive lifestyle modifications today with an emphasis on weight loss as well as increasing exercise and decreasing simple carbohydrates in her diet. We also reviewed medication options with an emphasis on risk versus benefit of those discussed.   Repetitive spaced learning was employed today to elicit superior memory formation and behavioral change.  9. Class 2 severe obesity with serious comorbidity and body mass index (BMI) of 38.0 to 38.9 in adult, unspecified obesity type (HCC) Dezaray is currently in the action stage of change and her goal is to continue with weight loss efforts. I recommend Laurie Choi begin the structured treatment plan as follows:  She has agreed to the Category 1 Plan.  Exercise goals: As is.   Behavioral modification strategies: increasing lean protein intake, decreasing simple carbohydrates, no skipping meals, meal planning and cooking strategies and planning for success.  She was informed of the importance of frequent follow-up visits to maximize her success with intensive lifestyle modifications for her multiple health conditions. She was informed we would discuss her lab results at her next visit unless there is a critical issue that needs to be addressed sooner. Tearah agreed to keep her next visit  at the agreed upon time to discuss these results.  Objective:   Blood pressure 120/82, pulse 83, temperature 98 F (36.7 C), height 5\' 3"  (1.6 m), weight 219 lb (99.3 kg), SpO2 96 %. Body mass index is 38.79 kg/m.  Indirect Calorimeter completed today shows a VO2 of 212 and a REE of 1475.  Her calculated basal metabolic rate is 9093 thus her basal metabolic rate is worse than expected.  General: Cooperative, alert, well developed, in no acute distress. HEENT:  Conjunctivae and lids unremarkable. Cardiovascular: Regular rhythm.  Lungs: Normal work of breathing. Neurologic: No focal deficits.   Lab Results  Component Value Date   CREATININE 0.68 04/11/2018   BUN 10 04/11/2018   NA 139 04/11/2018   K 3.7 04/11/2018   CL 103 04/11/2018   CO2 23 04/11/2018   Lab Results  Component Value Date   ALT 17 12/03/2017   AST 16 12/03/2017   ALKPHOS 68 12/03/2017   BILITOT 0.6 12/03/2017   Lab Results  Component Value Date   HGBA1C 5.8 (H) 12/20/2015   Lab Results  Component Value Date   TSH 1.650 08/18/2016   Lab Results  Component Value Date   CHOL 179 12/03/2017   HDL 54 12/03/2017   LDLCALC 114 (H) 12/03/2017   TRIG 57 12/03/2017   CHOLHDL 3.3 12/03/2017   Lab Results  Component Value Date   WBC 5.9 11/17/2016   HGB 13.0 11/17/2016   HCT 37.4 11/17/2016   MCV 86 11/17/2016   PLT 331 11/17/2016   Attestation Statements:   Reviewed by clinician on day of visit: allergies, medications, problem list, medical history, surgical history, family history, social history, and previous encounter notes.  I, Water quality scientist, CMA, am acting as Location manager for [employer], DO.  I have reviewed the above documentation for accuracy and completeness, and I agree with the above. Mellody Dance, DO

## 2019-10-13 LAB — COMPREHENSIVE METABOLIC PANEL
ALT: 11 IU/L (ref 0–32)
AST: 13 IU/L (ref 0–40)
Albumin/Globulin Ratio: 1.5 (ref 1.2–2.2)
Albumin: 4.4 g/dL (ref 3.8–4.8)
Alkaline Phosphatase: 84 IU/L (ref 48–121)
BUN/Creatinine Ratio: 16 (ref 9–23)
BUN: 12 mg/dL (ref 6–24)
Bilirubin Total: 0.6 mg/dL (ref 0.0–1.2)
CO2: 27 mmol/L (ref 20–29)
Calcium: 9.7 mg/dL (ref 8.7–10.2)
Chloride: 101 mmol/L (ref 96–106)
Creatinine, Ser: 0.74 mg/dL (ref 0.57–1.00)
GFR calc Af Amer: 117 mL/min/{1.73_m2} (ref >59)
GFR calc non Af Amer: 102 mL/min/{1.73_m2} (ref >59)
Globulin, Total: 2.9 g/dL (ref 1.5–4.5)
Glucose: 95 mg/dL (ref 65–99)
Potassium: 3.5 mmol/L (ref 3.5–5.2)
Sodium: 141 mmol/L (ref 134–144)
Total Protein: 7.3 g/dL (ref 6.0–8.5)

## 2019-10-13 LAB — CBC WITH DIFFERENTIAL/PLATELET
Basophils Absolute: 0 x10E3/uL (ref 0.0–0.2)
Basos: 0 %
EOS (ABSOLUTE): 0.3 x10E3/uL (ref 0.0–0.4)
Eos: 4 %
Hematocrit: 41.6 % (ref 34.0–46.6)
Hemoglobin: 13.3 g/dL (ref 11.1–15.9)
Immature Grans (Abs): 0 x10E3/uL (ref 0.0–0.1)
Immature Granulocytes: 0 %
Lymphocytes Absolute: 1.9 x10E3/uL (ref 0.7–3.1)
Lymphs: 28 %
MCH: 27.8 pg (ref 26.6–33.0)
MCHC: 32 g/dL (ref 31.5–35.7)
MCV: 87 fL (ref 79–97)
Monocytes Absolute: 0.3 x10E3/uL (ref 0.1–0.9)
Monocytes: 4 %
Neutrophils Absolute: 4.5 x10E3/uL (ref 1.4–7.0)
Neutrophils: 64 %
Platelets: 386 x10E3/uL (ref 150–450)
RBC: 4.78 x10E6/uL (ref 3.77–5.28)
RDW: 13.8 % (ref 11.7–15.4)
WBC: 7 x10E3/uL (ref 3.4–10.8)

## 2019-10-13 LAB — LIPID PANEL
Chol/HDL Ratio: 4 ratio (ref 0.0–4.4)
Cholesterol, Total: 210 mg/dL — ABNORMAL HIGH (ref 100–199)
HDL: 53 mg/dL (ref >39)
LDL Chol Calc (NIH): 144 mg/dL — ABNORMAL HIGH (ref 0–99)
Triglycerides: 75 mg/dL (ref 0–149)
VLDL Cholesterol Cal: 13 mg/dL (ref 5–40)

## 2019-10-13 LAB — INSULIN, RANDOM: INSULIN: 13.1 u[IU]/mL (ref 2.6–24.9)

## 2019-10-13 LAB — T3: T3, Total: 148 ng/dL (ref 71–180)

## 2019-10-13 LAB — TSH: TSH: 3.18 u[IU]/mL (ref 0.450–4.500)

## 2019-10-13 LAB — VITAMIN D 25 HYDROXY (VIT D DEFICIENCY, FRACTURES): Vit D, 25-Hydroxy: 15.1 ng/mL — ABNORMAL LOW (ref 30.0–100.0)

## 2019-10-13 LAB — FOLATE: Folate: 13.2 ng/mL (ref >3.0)

## 2019-10-13 LAB — HEMOGLOBIN A1C
Est. average glucose Bld gHb Est-mCnc: 128 mg/dL
Hgb A1c MFr Bld: 6.1 % — ABNORMAL HIGH (ref 4.8–5.6)

## 2019-10-13 LAB — VITAMIN B12: Vitamin B-12: 524 pg/mL (ref 232–1245)

## 2019-10-13 LAB — T4: T4, Total: 9.4 ug/dL (ref 4.5–12.0)

## 2019-10-14 ENCOUNTER — Other Ambulatory Visit: Payer: Self-pay | Admitting: Cardiovascular Disease

## 2019-10-24 ENCOUNTER — Ambulatory Visit (INDEPENDENT_AMBULATORY_CARE_PROVIDER_SITE_OTHER): Payer: BLUE CROSS/BLUE SHIELD | Admitting: Family Medicine

## 2019-10-25 ENCOUNTER — Encounter (INDEPENDENT_AMBULATORY_CARE_PROVIDER_SITE_OTHER): Payer: Self-pay | Admitting: Bariatrics

## 2019-10-25 DIAGNOSIS — E559 Vitamin D deficiency, unspecified: Secondary | ICD-10-CM | POA: Insufficient documentation

## 2019-10-25 DIAGNOSIS — R7303 Prediabetes: Secondary | ICD-10-CM | POA: Insufficient documentation

## 2019-10-26 ENCOUNTER — Ambulatory Visit (INDEPENDENT_AMBULATORY_CARE_PROVIDER_SITE_OTHER): Payer: BC Managed Care – PPO | Admitting: Bariatrics

## 2019-10-26 ENCOUNTER — Other Ambulatory Visit: Payer: Self-pay

## 2019-10-26 ENCOUNTER — Encounter (INDEPENDENT_AMBULATORY_CARE_PROVIDER_SITE_OTHER): Payer: Self-pay | Admitting: Bariatrics

## 2019-10-26 ENCOUNTER — Ambulatory Visit (INDEPENDENT_AMBULATORY_CARE_PROVIDER_SITE_OTHER): Payer: BLUE CROSS/BLUE SHIELD | Admitting: Family Medicine

## 2019-10-26 VITALS — BP 127/83 | HR 81 | Temp 98.6°F | Ht 63.0 in | Wt 220.0 lb

## 2019-10-26 DIAGNOSIS — E559 Vitamin D deficiency, unspecified: Secondary | ICD-10-CM | POA: Diagnosis not present

## 2019-10-26 DIAGNOSIS — R7303 Prediabetes: Secondary | ICD-10-CM

## 2019-10-26 DIAGNOSIS — Z9189 Other specified personal risk factors, not elsewhere classified: Secondary | ICD-10-CM | POA: Diagnosis not present

## 2019-10-26 DIAGNOSIS — Z6839 Body mass index (BMI) 39.0-39.9, adult: Secondary | ICD-10-CM

## 2019-10-26 DIAGNOSIS — E7800 Pure hypercholesterolemia, unspecified: Secondary | ICD-10-CM | POA: Diagnosis not present

## 2019-10-26 MED ORDER — VITAMIN D (ERGOCALCIFEROL) 1.25 MG (50000 UNIT) PO CAPS: 50000.0000 [IU] | ORAL_CAPSULE | ORAL | 0 refills | Status: DC

## 2019-10-26 NOTE — Progress Notes (Signed)
Chief Complaint:   [name] is here to discuss her progress with her obesity treatment plan along with follow-up of her obesity related diagnoses. Laurie Choi is on the Category 1 Plan and states she is following her eating plan approximately 20% of the time. [name] states she is exercising 0 minutes 0 times per week.  Today's visit was #: 2 Starting weight: 219 lbs Starting date: 10/12/2019 Today's weight: 220 lbs Today's date: 10/26/2019 Total lbs lost to date: 0 Total lbs lost since last in-office visit: 0  Interim History: Laurie Choi is up 1 lbs. She did not have a good time with the plan due to multiple stressors.  Subjective:   Prediabetes. Laurie Choi has a diagnosis of prediabetes based on her elevated HgA1c and was informed this puts her at greater risk of developing diabetes. She continues to work on diet and exercise to decrease her risk of diabetes. She denies nausea or hypoglycemia. No polyphagia.  Lab Results  Component Value Date   HGBA1C 6.1 (H) 10/12/2019   Lab Results  Component Value Date   INSULIN 13.1 10/12/2019   Vitamin D deficiency. No nausea, vomiting, or muscle weakness.    Ref. Range 10/12/2019 13:02  Vitamin D, 25-Hydroxy Latest Ref Range: 30.0 - 100.0 ng/mL 15.1 (L)   Elevated cholesterol. Laurie Choi is on no medication.   Ref. Range 10/12/2019 13:02  Cholesterol, Total Latest Ref Range: 100 - 199 mg/dL 210 (H)    Ref. Range 10/12/2019 13:02  LDL Chol Calc (NIH) Latest Ref Range: 0 - 99 mg/dL 144 (H)   At risk for osteoporosis. Laurie Choi is at higher risk of osteopenia and osteoporosis due to Vitamin D deficiency.   Assessment/Plan:   Prediabetes. Aliviah will continue to work on weight loss, exercise, and decreasing simple carbohydrates to help decrease the risk of diabetes. Handout was provided on Insulin Resistance and Prediabetes. We discussed metformin and handout was given.  Vitamin D deficiency. Low Vitamin D level  contributes to fatigue and are associated with obesity, breast, and colon cancer. She was given a prescription for Vitamin D, Ergocalciferol, (DRISDOL) 1.25 MG (50000 UNIT) CAPS capsule every week #4 with 0 refills and will follow-up for routine testing of Vitamin D, at least 2-3 times per year to avoid over-replacement.   Elevated cholesterol. Samiha will continue to work on diet and exercise.  At risk for osteoporosis. Chiyoko was given approximately 10 minutes of osteoporosis prevention counseling today. Laurie Choi is at risk for osteopenia and osteoporosis due to her Vitamin D deficiency. She was encouraged to take her Vitamin D and follow her higher calcium diet and increase strengthening exercise to help strengthen her bones and decrease her risk of osteopenia and osteoporosis.  Repetitive spaced learning was employed today to elicit superior memory formation and behavioral change.  Class 2 severe obesity with serious comorbidity and body mass index (BMI) of 39.0 to 39.9 in adult, unspecified obesity type ([place]).  Laurie Choi is currently in the action stage of change. As such, her goal is to continue with weight loss efforts. She has agreed to the Category 1 Plan.   She will work on meal planning and intentional eating.   We independently reviewed with the patient labs from 10/12/2019 including CMP, lipids, Vitamin D, CBC, A1c, insulin, and thyroid panel.  Exercise goals: All adults should avoid inactivity. Some physical activity is better than none, and adults who participate in any amount of physical activity gain some health benefits.  Behavioral modification strategies:  increasing lean protein intake, decreasing simple carbohydrates, increasing vegetables, increasing water intake, decreasing eating out, no skipping meals, meal planning and cooking strategies, keeping healthy foods in the home and planning for success.  Laurie Choi has agreed to follow-up with our clinic in 2 weeks. She was  informed of the importance of frequent follow-up visits to maximize her success with intensive lifestyle modifications for her multiple health conditions.   Objective:   Pulse 81, temperature 98.6 F (37 C), height 5\' 3"  (1.6 m), weight 220 lb (99.8 kg), SpO2 96 %. Body mass index is 38.97 kg/m.  General: Cooperative, alert, well developed, in no acute distress. HEENT: Conjunctivae and lids unremarkable. Cardiovascular: Regular rhythm.  Lungs: Normal work of breathing. Neurologic: No focal deficits.   Lab Results  Component Value Date   CREATININE 0.74 10/12/2019   BUN 12 10/12/2019   NA 141 10/12/2019   K 3.5 10/12/2019   CL 101 10/12/2019   CO2 27 10/12/2019   Lab Results  Component Value Date   ALT 11 10/12/2019   AST 13 10/12/2019   ALKPHOS 84 10/12/2019   BILITOT 0.6 10/12/2019   Lab Results  Component Value Date   HGBA1C 6.1 (H) 10/12/2019   HGBA1C 5.8 (H) 12/20/2015   Lab Results  Component Value Date   INSULIN 13.1 10/12/2019   Lab Results  Component Value Date   TSH 3.180 10/12/2019   Lab Results  Component Value Date   CHOL 210 (H) 10/12/2019   HDL 53 10/12/2019   LDLCALC 144 (H) 10/12/2019   TRIG 75 10/12/2019   CHOLHDL 4.0 10/12/2019   Lab Results  Component Value Date   WBC 7.0 10/12/2019   HGB 13.3 10/12/2019   HCT 41.6 10/12/2019   MCV 87 10/12/2019   PLT 386 10/12/2019   No results found for: IRON, TIBC, FERRITIN  Attestation Statements:   Reviewed by clinician on day of visit: allergies, medications, problem list, medical history, surgical history, family history, social history, and previous encounter notes.  Migdalia Dk, am acting as Location manager for [employer], DO   I have reviewed the above documentation for accuracy and completeness, and I agree with the above. Jearld Lesch, DO

## 2019-11-07 ENCOUNTER — Ambulatory Visit (INDEPENDENT_AMBULATORY_CARE_PROVIDER_SITE_OTHER): Payer: BLUE CROSS/BLUE SHIELD | Admitting: Family Medicine

## 2019-11-22 ENCOUNTER — Other Ambulatory Visit: Payer: Self-pay | Admitting: Cardiovascular Disease

## 2020-01-07 ENCOUNTER — Other Ambulatory Visit: Payer: Self-pay | Admitting: Cardiovascular Disease

## 2020-01-09 ENCOUNTER — Other Ambulatory Visit: Payer: Self-pay

## 2020-01-09 DIAGNOSIS — Z8249 Family history of ischemic heart disease and other diseases of the circulatory system: Secondary | ICD-10-CM | POA: Diagnosis not present

## 2020-01-09 DIAGNOSIS — Z0000 Encounter for general adult medical examination without abnormal findings: Secondary | ICD-10-CM | POA: Diagnosis not present

## 2020-01-09 DIAGNOSIS — R7303 Prediabetes: Secondary | ICD-10-CM | POA: Diagnosis not present

## 2020-01-09 DIAGNOSIS — R0683 Snoring: Secondary | ICD-10-CM | POA: Diagnosis not present

## 2020-01-09 DIAGNOSIS — M7989 Other specified soft tissue disorders: Secondary | ICD-10-CM | POA: Diagnosis not present

## 2020-01-09 DIAGNOSIS — Z13220 Encounter for screening for lipoid disorders: Secondary | ICD-10-CM | POA: Diagnosis not present

## 2020-01-09 DIAGNOSIS — F321 Major depressive disorder, single episode, moderate: Secondary | ICD-10-CM | POA: Diagnosis not present

## 2020-01-09 DIAGNOSIS — I119 Hypertensive heart disease without heart failure: Secondary | ICD-10-CM | POA: Diagnosis not present

## 2020-01-09 DIAGNOSIS — R600 Localized edema: Secondary | ICD-10-CM | POA: Diagnosis not present

## 2020-01-09 DIAGNOSIS — Z6841 Body Mass Index (BMI) 40.0 and over, adult: Secondary | ICD-10-CM | POA: Diagnosis not present

## 2020-01-09 DIAGNOSIS — Z833 Family history of diabetes mellitus: Secondary | ICD-10-CM | POA: Diagnosis not present

## 2020-01-09 DIAGNOSIS — I10 Essential (primary) hypertension: Secondary | ICD-10-CM | POA: Diagnosis not present

## 2020-01-09 DIAGNOSIS — E559 Vitamin D deficiency, unspecified: Secondary | ICD-10-CM | POA: Diagnosis not present

## 2020-02-14 ENCOUNTER — Institutional Professional Consult (permissible substitution): Payer: BC Managed Care – PPO | Admitting: Neurology

## 2020-03-10 DIAGNOSIS — Z20822 Contact with and (suspected) exposure to covid-19: Secondary | ICD-10-CM | POA: Diagnosis not present

## 2020-03-10 DIAGNOSIS — J029 Acute pharyngitis, unspecified: Secondary | ICD-10-CM | POA: Diagnosis not present

## 2020-09-18 DIAGNOSIS — R0683 Snoring: Secondary | ICD-10-CM | POA: Diagnosis not present

## 2020-09-18 DIAGNOSIS — T502X5A Adverse effect of carbonic-anhydrase inhibitors, benzothiadiazides and other diuretics, initial encounter: Secondary | ICD-10-CM | POA: Diagnosis not present

## 2020-09-18 DIAGNOSIS — F321 Major depressive disorder, single episode, moderate: Secondary | ICD-10-CM | POA: Diagnosis not present

## 2020-09-18 DIAGNOSIS — Z0189 Encounter for other specified special examinations: Secondary | ICD-10-CM | POA: Diagnosis not present

## 2020-09-18 DIAGNOSIS — G479 Sleep disorder, unspecified: Secondary | ICD-10-CM | POA: Diagnosis not present

## 2020-09-18 DIAGNOSIS — E876 Hypokalemia: Secondary | ICD-10-CM | POA: Diagnosis not present

## 2020-09-18 DIAGNOSIS — I10 Essential (primary) hypertension: Secondary | ICD-10-CM | POA: Diagnosis not present

## 2020-12-02 DIAGNOSIS — S39012A Strain of muscle, fascia and tendon of lower back, initial encounter: Secondary | ICD-10-CM | POA: Diagnosis not present

## 2021-02-09 ENCOUNTER — Other Ambulatory Visit: Payer: Self-pay | Admitting: Cardiovascular Disease

## 2021-02-26 DIAGNOSIS — L509 Urticaria, unspecified: Secondary | ICD-10-CM | POA: Diagnosis not present

## 2021-02-26 DIAGNOSIS — R5383 Other fatigue: Secondary | ICD-10-CM | POA: Diagnosis not present

## 2021-02-26 DIAGNOSIS — Z13220 Encounter for screening for lipoid disorders: Secondary | ICD-10-CM | POA: Diagnosis not present

## 2021-02-26 DIAGNOSIS — I10 Essential (primary) hypertension: Secondary | ICD-10-CM | POA: Diagnosis not present

## 2021-02-26 DIAGNOSIS — Z0000 Encounter for general adult medical examination without abnormal findings: Secondary | ICD-10-CM | POA: Diagnosis not present

## 2021-02-26 DIAGNOSIS — Z1321 Encounter for screening for nutritional disorder: Secondary | ICD-10-CM | POA: Diagnosis not present

## 2021-02-26 DIAGNOSIS — E559 Vitamin D deficiency, unspecified: Secondary | ICD-10-CM | POA: Diagnosis not present

## 2021-03-12 NOTE — Progress Notes (Signed)
New Patient Note  RE: Laurie Choi MRN: [id number] DOB: August 29, 1980 Date of Office Visit: 03/13/2021  Consult requested by: Laurie Bill, MD Primary care provider: Bartholome Bill, MD  Chief Complaint: Urticaria (Pt states she have been breaking out into hives for years off and on. But her recent episode was 2 weeks before thanksgiving. She usually would breakout all over. She states that she takes benadryl for relieve. This recent episode was into her mouth, ears, btw toes, under feet.)  History of Present Illness: I had the pleasure of seeing Laurie Choi for initial evaluation at the [facility] on 03/13/2021. She is a 41 y.o. female, who is referred here by Laurie Bill, MD for the evaluation of hives.  Rash started about age 41 and the most recent episode was in November.  This can occur anywhere on her body. Describes them as hot, painful, slightly itchy, raised. Individual rashes lasts about less than 1 day. No ecchymosis upon resolution. Associated symptoms include: none.  Frequency of episodes: very rare episodes during high stress time. Before this episode no issues for about 5 years. At age 75, patient's mother passed away which was very stressful for her.  She went back to the workforce this summer and not sure if that is stressful for her.   Suspected triggers are stress. Denies any fevers, chills, changes in medications, foods, personal care products or recent infections. She has tried the following therapies: benadryl prn with some benefit. Systemic steroids - not sure. Currently on benadryl 62m QHS.  Previous work up includes: 02/26/21 CBC diff, TSH, ESR, CMP (low K). Patient is up to date with the following cancer screening tests: physical exam, mammogram, pap smears.  Assessment and Plan: [name] a 41y.o. female with: Urticaria History of hives since age 41  Quiescent for many years however in November started to  break out again.  Denies any changes in diet, medication, personal care products or recent infections.  Main triggers seem to be stress.  2022 blood work CBC differential, TSH, ESR and CMP unremarkable except for low potassium.  Takes Benadryl 50 mg at night with some benefit. Based on clinical history, she likely has chronic idiopathic urticaria. Discussed with patient, that urticaria is usually caused by release of histamine by cutaneous mast cells but sometimes it is non-histamine mediated. Explained that urticaria is not always associated with allergies. In most cases, the exact etiology for urticaria can not be established and it is considered idiopathic. No indication for any allergy testing today.  Start zyrtec (cetirizine) 175mtwice a day. If symptoms are not controlled or causes drowsiness let usKoreanow. Start Pepcid (famotidine) 20104mwice a day.  Avoid the following potential triggers: alcohol, tight clothing, NSAIDs, hot showers and getting overheated. If no improvement with the medications then will consider adding Xolair next.  Get bloodwork to rule out other etiologies.   Return in about 2 months (around 05/11/2021).  Meds ordered this encounter  Medications   famotidine (PEPCID) 20 MG tablet    Sig: Take 1 tablet (20 mg total) by mouth 2 (two) times daily.    Dispense:  60 tablet    Refill:  3   cetirizine (ZYRTEC ALLERGY) 10 MG tablet    Sig: Take 1 tablet (10 mg total) by mouth 2 (two) times daily.    Dispense:  60 tablet    Refill:  3   Lab Orders  Alpha-Gal Panel         ANA w/Reflex         Chronic Urticaria         Tryptase         C-reactive protein         C3 and C4      Other allergy screening: Asthma: no Rhino conjunctivitis:  used to in the past but has been improving Food allergy: no Medication allergy: no Hymenoptera allergy: no Eczema:no History of recurrent infections suggestive of immunodeficency: no  Diagnostics: None.   Past Medical  History: Patient Active Problem List   Diagnosis Date Noted   Urticaria 03/13/2021   Prediabetes 10/25/2019   Vitamin D deficiency 10/25/2019   Menorrhagia 04/17/2017   Essential hypertension 12/29/2016   Accelerated hypertension 08/18/2016   Anxiety state 08/18/2016   Heart failure with preserved ejection fraction ([place]) 12/24/2015   Bilateral hand pain 12/24/2015   Depression 12/24/2015   Atypical migraine 12/20/2015   Severe obesity (BMI >= 40) (HCC)    Past Medical History:  Diagnosis Date   Anemia    Anxiety    Benign essential HTN    Depression    Heart valve problem    High blood pressure    Hyperemesis gravidarum before end of [redacted] week gestation, dehydration    Sleep apnea    Trouble in sleeping    Urticaria    Past Surgical History: Past Surgical History:  Procedure Laterality Date   PICC LINE PLACE PERIPHERAL ([place] [facility])  2011   for rx and hydration 2nd hyperemesis   Medication List:  Current Outpatient Medications  Medication Sig Dispense Refill   amLODipine (NORVASC) 5 MG tablet TAKE 1 TABLET BY MOUTH DAILY 90 tablet 0   cetirizine (ZYRTEC ALLERGY) 10 MG tablet Take 1 tablet (10 mg total) by mouth 2 (two) times daily. 60 tablet 3   chlorthalidone (HYGROTON) 25 MG tablet TAKE 1 TABLET(25 MG) BY MOUTH DAILY 90 tablet 3   famotidine (PEPCID) 20 MG tablet Take 1 tablet (20 mg total) by mouth 2 (two) times daily. 60 tablet 3   FLUoxetine (PROZAC) 10 MG capsule Take 10 mg by mouth daily.     Melaton-Thean-Cham-PassF-LBalm (MELATONIN + L-THEANINE) CAPS Take 10 mg by mouth.     potassium chloride (KLOR-CON M) 10 MEQ tablet      Vitamin D, Ergocalciferol, (DRISDOL) 1.25 MG (50000 UNIT) CAPS capsule Take 1 capsule (50,000 Units total) by mouth every 7 (seven) days. 4 capsule 0   No current facility-administered medications for this visit.   Allergies: Allergies  Allergen Reactions   Tape Other (See Comments)    Medical redness, rash itching    Milk-Related  Compounds    Social History: Social History   Socioeconomic History   Marital status: Married    Spouse name: Laurie Choi   Number of children: 2   Years of education: Not on file   Highest education level: Not on file  Occupational History   Occupation: Stay at home mother    Employer: UNEMPLOYED  Tobacco Use   Smoking status: Never   Smokeless tobacco: Never  Substance and Sexual Activity   Alcohol use: No   Drug use: No   Sexual activity: Yes    Birth control/protection: Rhythm  Other Topics Concern   Not on file  Social History Narrative   Not on file   Social Determinants of Health   Financial Resource Strain: Not on file  Food Insecurity:  Not on file  Transportation Needs: Not on file  Physical Activity: Not on file  Stress: Not on file  Social Connections: Not on file   Lives in a 42 year old house. Smoking: denies Occupation: Information systems manager HistoryFreight forwarder in the house: no Charity fundraiser in the family room: no Carpet in the bedroom: yes Heating: electric Cooling: central Pet: yes 1 dog  x 2 yrs  Family History: Family History  Problem Relation Age of Onset   Lupus Mother 64   Kidney disease Mother    Hypertension Father    Diabetes Father    Hypertension Paternal Grandmother    Hypertension Paternal Grandfather    Heart attack Maternal Grandmother    Problem                               Relation Asthma                                   Daughter  Eczema                                no Food allergy                          no Allergic rhino conjunctivitis     no  Review of Systems  Constitutional:  Negative for appetite change, chills, fever and unexpected weight change.  HENT:  Positive for congestion. Negative for rhinorrhea.   Eyes:  Negative for itching.  Respiratory:  Negative for cough, chest tightness, shortness of breath and wheezing.   Cardiovascular:  Negative for chest pain.  Gastrointestinal:   Negative for abdominal pain.  Genitourinary:  Negative for difficulty urinating.  Skin:  Positive for rash.  Neurological:  Negative for headaches.   Objective: BP 122/80    Pulse 72    Temp 98.2 F (36.8 C) (Temporal)    Resp 17    Ht '5\' 4"'  (1.626 m)    Wt 229 lb (103.9 kg)    SpO2 100%    BMI 39.31 kg/m  Body mass index is 39.31 kg/m. Physical Exam Vitals and nursing note reviewed.  Constitutional:      Appearance: Normal appearance. She is well-developed.  HENT:     Head: Normocephalic and atraumatic.     Right Ear: Tympanic membrane and external ear normal.     Left Ear: Tympanic membrane and external ear normal.     Nose: Congestion (on left side) present.     Mouth/Throat:     Mouth: Mucous membranes are moist.     Pharynx: Oropharynx is clear.  Eyes:     Conjunctiva/sclera: Conjunctivae normal.  Cardiovascular:     Rate and Rhythm: Normal rate and regular rhythm.     Heart sounds: Normal heart sounds. No murmur heard.   No friction rub. No gallop.  Pulmonary:     Effort: Pulmonary effort is normal.     Breath sounds: Normal breath sounds. No wheezing, rhonchi or rales.  Musculoskeletal:     Cervical back: Neck supple.  Skin:    General: Skin is warm.     Findings: No rash.  Neurological:     Mental Status: She is alert and oriented to person, place, and time.  Psychiatric:        Behavior: Behavior normal.  The plan was reviewed with the patient/family, and all questions/concerned were addressed.  It was my pleasure to see Kaleen today and participate in her care. Please feel free to contact me with any questions or concerns.  Sincerely,  Rexene Alberts, DO Allergy & Immunology  [facility] office: [place] office: [phone]

## 2021-03-13 ENCOUNTER — Other Ambulatory Visit: Payer: Self-pay

## 2021-03-13 ENCOUNTER — Ambulatory Visit: Payer: BC Managed Care – PPO | Admitting: Allergy

## 2021-03-13 ENCOUNTER — Encounter: Payer: Self-pay | Admitting: Allergy

## 2021-03-13 VITALS — BP 122/80 | HR 72 | Temp 98.2°F | Resp 17 | Ht 64.0 in | Wt 229.0 lb

## 2021-03-13 DIAGNOSIS — L509 Urticaria, unspecified: Secondary | ICD-10-CM

## 2021-03-13 MED ORDER — CETIRIZINE HCL 10 MG PO TABS: 10.0000 mg | ORAL_TABLET | Freq: Two times a day (BID) | ORAL | 3 refills | Status: DC

## 2021-03-13 MED ORDER — FAMOTIDINE 20 MG PO TABS: 20.0000 mg | ORAL_TABLET | Freq: Two times a day (BID) | ORAL | 3 refills | Status: DC

## 2021-03-13 NOTE — Patient Instructions (Addendum)
Hives: Based on clinical history, she likely has chronic idiopathic urticaria. Discussed with patient, that urticaria is usually caused by release of histamine by cutaneous mast cells but sometimes it is non-histamine mediated. Explained that urticaria is not always associated with allergies. In most cases, the exact etiology for urticaria can not be established and it is considered idiopathic.  Start zyrtec (cetirizine) 10mg  twice a day. If symptoms are not controlled or causes drowsiness let us know. Start pepcid (famotidine) 20mg  twice a day.  Avoid the following potential triggers: alcohol, tight clothing, NSAIDs, hot showers and getting overheated. If no improvement with the medications then will consider adding Xolair next.  [email or web address] Get bloodwork:  We are ordering labs, so please allow 1-2 weeks for the results to come back. With the newly implemented Cures Act, the labs might be visible to you at the same time that they become visible to me. However, I will not address the results until all of the results are back, so please be patient.    Follow up in 2 months or sooner if needed.

## 2021-03-13 NOTE — Assessment & Plan Note (Signed)
History of hives since age 41.  Quiescent for many years however in November started to break out again.  Denies any changes in diet, medication, personal care products or recent infections.  Main triggers seem to be stress.  2022 blood work CBC differential, TSH, ESR and CMP unremarkable except for low potassium.  Takes Benadryl 50 mg at night with some benefit.  Based on clinical history, she likely has chronic idiopathic urticaria. Discussed with patient, that urticaria is usually caused by release of histamine by cutaneous mast cells but sometimes it is non-histamine mediated. Explained that urticaria is not always associated with allergies. In most cases, the exact etiology for urticaria can not be established and it is considered idiopathic.  No indication for any allergy testing today.   Start zyrtec (cetirizine) 43m twice a day.  If symptoms are not controlled or causes drowsiness let [place]now.  Start Pepcid (famotidine) 285mtwice a day.   Avoid the following potential triggers: alcohol, tight clothing, NSAIDs, hot showers and getting overheated.  If no improvement with the medications then will consider adding Xolair next.   Get bloodwork to rule out other etiologies.

## 2021-05-06 ENCOUNTER — Ambulatory Visit: Payer: BC Managed Care – PPO | Admitting: Allergy

## 2021-10-08 ENCOUNTER — Encounter (INDEPENDENT_AMBULATORY_CARE_PROVIDER_SITE_OTHER): Payer: Self-pay

## 2021-10-27 ENCOUNTER — Inpatient Hospital Stay (HOSPITAL_COMMUNITY): Payer: BC Managed Care – PPO

## 2021-10-27 ENCOUNTER — Encounter (HOSPITAL_COMMUNITY): Payer: Self-pay | Admitting: *Deleted

## 2021-10-27 ENCOUNTER — Inpatient Hospital Stay (HOSPITAL_COMMUNITY)
Admission: AD | Admit: 2021-10-27 | Discharge: 2021-10-27 | Disposition: A | Discharge status: Home / Self Care | Payer: BC Managed Care – PPO | Attending: Obstetrics and Gynecology | Admitting: Obstetrics and Gynecology

## 2021-10-27 DIAGNOSIS — Z6790 Unspecified blood type, Rh positive: Secondary | ICD-10-CM

## 2021-10-27 DIAGNOSIS — O209 Hemorrhage in early pregnancy, unspecified: Secondary | ICD-10-CM | POA: Insufficient documentation

## 2021-10-27 DIAGNOSIS — O26891 Other specified pregnancy related conditions, first trimester: Secondary | ICD-10-CM | POA: Insufficient documentation

## 2021-10-27 DIAGNOSIS — Z3A08 8 weeks gestation of pregnancy: Secondary | ICD-10-CM | POA: Insufficient documentation

## 2021-10-27 DIAGNOSIS — F32A Depression, unspecified: Secondary | ICD-10-CM | POA: Insufficient documentation

## 2021-10-27 DIAGNOSIS — O99351 Diseases of the nervous system complicating pregnancy, first trimester: Secondary | ICD-10-CM | POA: Insufficient documentation

## 2021-10-27 DIAGNOSIS — O3680X0 Pregnancy with inconclusive fetal viability, not applicable or unspecified: Secondary | ICD-10-CM | POA: Diagnosis not present

## 2021-10-27 DIAGNOSIS — Z79899 Other long term (current) drug therapy: Secondary | ICD-10-CM | POA: Insufficient documentation

## 2021-10-27 DIAGNOSIS — F419 Anxiety disorder, unspecified: Secondary | ICD-10-CM | POA: Insufficient documentation

## 2021-10-27 DIAGNOSIS — R1030 Lower abdominal pain, unspecified: Secondary | ICD-10-CM | POA: Diagnosis present

## 2021-10-27 DIAGNOSIS — O99341 Other mental disorders complicating pregnancy, first trimester: Secondary | ICD-10-CM | POA: Insufficient documentation

## 2021-10-27 DIAGNOSIS — O10911 Unspecified pre-existing hypertension complicating pregnancy, first trimester: Secondary | ICD-10-CM | POA: Insufficient documentation

## 2021-10-27 DIAGNOSIS — O3411 Maternal care for benign tumor of corpus uteri, first trimester: Secondary | ICD-10-CM | POA: Insufficient documentation

## 2021-10-27 LAB — CBC
HCT: 33.4 % — ABNORMAL LOW (ref 36.0–46.0)
Hemoglobin: 11.3 g/dL — ABNORMAL LOW (ref 12.0–15.0)
MCH: 27.5 pg (ref 26.0–34.0)
MCHC: 33.8 g/dL (ref 30.0–36.0)
MCV: 81.3 fL (ref 80.0–100.0)
Platelets: 325 10*3/uL (ref 150–400)
RBC: 4.11 MIL/uL (ref 3.87–5.11)
RDW: 16.2 % — ABNORMAL HIGH (ref 11.5–15.5)
WBC: 9.3 10*3/uL (ref 4.0–10.5)
nRBC: 0 % (ref 0.0–0.2)

## 2021-10-27 LAB — POCT PREGNANCY, URINE: Preg Test, Ur: POSITIVE — AB

## 2021-10-27 LAB — WET PREP, GENITAL
Clue Cells Wet Prep HPF POC: NONE SEEN
Sperm: NONE SEEN
Trich, Wet Prep: NONE SEEN
WBC, Wet Prep HPF POC: <10 (ref <10)
Yeast Wet Prep HPF POC: NONE SEEN

## 2021-10-27 LAB — HCG, QUANTITATIVE, PREGNANCY: hCG, Beta Chain, Quant, S: 2810 m[IU]/mL — ABNORMAL HIGH (ref <5)

## 2021-10-27 NOTE — MAU Note (Signed)
Laurie Choi is a 41 y.o. at Unknown here in MAU reporting: Sat, +HPT .  Started bleeding on Sunday, with few small clots.  Today has had heavier bleeding, passing several clots, the last was the size of "a baby mouse".  Was bleeding last wk, very dark- only saw when she wiped. Cramping. Had IUD removed and replaced 7/5.  Checked for strings in office 8/17- were still present.  LMP: end of June? Prior to IUD replacement Onset of complaint: over a wk Pain score: 7 Vitals:   10/27/21 1824  BP: 139/86  Pulse: 82  Resp: 20  Temp: 99.2 F (37.3 C)  SpO2: 99%      Lab orders placed from triage:  upt

## 2021-10-27 NOTE — MAU Provider Note (Signed)
History     CSN: [id number]  Arrival date and time: 10/27/21 1801   Event Date/Time   First Provider Initiated Contact with Patient 10/27/21 1847      Chief Complaint  Patient presents with   Vaginal Bleeding   Abdominal Pain   Possible Pregnancy   41 y.o. [id number] '@[redacted]w[redacted]d'$  by unsure LMP presenting with VB in the setting of +HPT. Reports onset of spotting yesterday and became heavier today. Reports passing some clots. Endorses low abdominal cramping. Rates pain 7/10. Of note she had a Paragard removed and replaced on 09/03/21 and a normal string check visit on 10/16/21.    OB History     Gravida  3   Para  2   Term  1   Preterm      AB      Living  2      SAB      IAB      Ectopic      Multiple      Live Births  1           Past Medical History:  Diagnosis Date   Anemia    Anxiety    Benign essential HTN    Depression    Heart valve problem    High blood pressure    Hyperemesis gravidarum before end of [redacted] week gestation, dehydration    Sleep apnea    Trouble in sleeping    Urticaria     Past Surgical History:  Procedure Laterality Date   PICC LINE PLACE PERIPHERAL ([place] [facility])  2011   for rx and hydration 2nd hyperemesis    Family History  Problem Relation Age of Onset   Lupus Mother 58   Kidney disease Mother    Hypertension Father    Diabetes Father    Hypertension Paternal Grandmother    Hypertension Paternal Grandfather    Heart attack Maternal Grandmother     Social History   Tobacco Use   Smoking status: Never   Smokeless tobacco: Never  Substance Use Topics   Alcohol use: No   Drug use: No    Allergies:  Allergies  Allergen Reactions   Tape Other (See Comments)    Medical redness, rash itching    Milk-Related Compounds     Medications Prior to Admission  Medication Sig Dispense Refill Last Dose   amLODipine (NORVASC) 5 MG tablet TAKE 1 TABLET BY MOUTH DAILY 90 tablet 0 Past Week   chlorthalidone (HYGROTON) 25 MG  tablet TAKE 1 TABLET(25 MG) BY MOUTH DAILY 90 tablet 3 Past Week   FLUoxetine (PROZAC) 10 MG capsule Take 10 mg by mouth daily.   Past Week   potassium chloride (KLOR-CON M) 10 MEQ tablet    Past Month   Vitamin D, Ergocalciferol, (DRISDOL) 1.25 MG (50000 UNIT) CAPS capsule Take 1 capsule (50,000 Units total) by mouth every 7 (seven) days. 4 capsule 0 Past Week   cetirizine (ZYRTEC ALLERGY) 10 MG tablet Take 1 tablet (10 mg total) by mouth 2 (two) times daily. (Patient not taking: Reported on 10/27/2021) 60 tablet 3 Not Taking   famotidine (PEPCID) 20 MG tablet Take 1 tablet (20 mg total) by mouth 2 (two) times daily. (Patient not taking: Reported on 10/27/2021) 60 tablet 3 Not Taking   Melaton-Thean-Cham-PassF-LBalm (MELATONIN + L-THEANINE) CAPS Take 10 mg by mouth. (Patient not taking: Reported on 10/27/2021)   Not Taking    Review of Systems  Constitutional:  Negative for fever.  Gastrointestinal:  Positive for abdominal pain.  Genitourinary:  Positive for vaginal bleeding.   Physical Exam   Blood pressure 139/86, pulse 82, temperature 99.2 F (37.3 C), temperature source Oral, resp. rate 20, height '5\' 2"'$  (1.575 m), weight 103.6 kg, last menstrual period 08/27/2021, SpO2 99 %.  Physical Exam Vitals and nursing note reviewed.  Constitutional:      General: She is not in acute distress.    Appearance: Normal appearance.  HENT:     Head: Normocephalic and atraumatic.  Cardiovascular:     Rate and Rhythm: Normal rate.  Pulmonary:     Effort: Pulmonary effort is normal. No respiratory distress.  Abdominal:     General: There is no distension.     Palpations: Abdomen is soft. There is no mass.     Tenderness: There is abdominal tenderness in the suprapubic area. There is no guarding or rebound.     Hernia: No hernia is present.  Genitourinary:    Comments: External: no lesions or erythema Vagina: rugated, pink, moist, scant bloody discharge Cervix closed   Musculoskeletal:         General: Normal range of motion.     Cervical back: Normal range of motion.  Skin:    General: Skin is warm and dry.  Neurological:     General: No focal deficit present.     Mental Status: She is alert and oriented to person, place, and time.  Psychiatric:        Mood and Affect: Mood normal.        Behavior: Behavior normal.    Results for orders placed or performed during the hospital encounter of 10/27/21 (from the past 24 hour(s))  Pregnancy, urine POC     Status: Abnormal   Collection Time: 10/27/21  6:31 PM  Result Value Ref Range   Preg Test, Ur POSITIVE (A) NEGATIVE  CBC     Status: Abnormal   Collection Time: 10/27/21  7:10 PM  Result Value Ref Range   WBC 9.3 4.0 - 10.5 K/uL   RBC 4.11 3.87 - 5.11 MIL/uL   Hemoglobin 11.3 (L) 12.0 - 15.0 g/dL   HCT 33.4 (L) 36.0 - 46.0 %   MCV 81.3 80.0 - 100.0 fL   MCH 27.5 26.0 - 34.0 pg   MCHC 33.8 30.0 - 36.0 g/dL   RDW 16.2 (H) 11.5 - 15.5 %   Platelets 325 150 - 400 K/uL   nRBC 0.0 0.0 - 0.2 %  hCG, quantitative, pregnancy     Status: Abnormal   Collection Time: 10/27/21  7:10 PM  Result Value Ref Range   hCG, Beta Chain, Quant, S 2,810 (H) <5 mIU/mL  Wet prep, genital     Status: None   Collection Time: 10/27/21  7:12 PM   Specimen: PATH Cytology Cervicovaginal Ancillary Only  Result Value Ref Range   Yeast Wet Prep HPF POC NONE SEEN NONE SEEN   Trich, Wet Prep NONE SEEN NONE SEEN   Clue Cells Wet Prep HPF POC NONE SEEN NONE SEEN   WBC, Wet Prep HPF POC <10 <10   Sperm NONE SEEN    US OB LESS THAN 14 WEEKS WITH OB TRANSVAGINAL  Result Date: 10/27/2021 CLINICAL DATA:  IUD placed 09/13/2021. EXAM: OBSTETRIC <14 WK [place] AND TRANSVAGINAL OB US TECHNIQUE: Both transabdominal and transvaginal ultrasound examinations were performed for complete evaluation of the gestation as well as the maternal uterus, adnexal regions, and pelvic cul-de-sac. Transvaginal technique was performed to  assess early pregnancy. COMPARISON:   02/27/2010. FINDINGS: Intrauterine gestational sac: None Yolk sac:  No Embryo:  No Cardiac Activity: No Heart Rate: None Subchorionic hemorrhage:  None visualized. Maternal uterus/adnexae: Fibroids are noted in the superior fundus measuring 7.3 x 6.3 x 6.9 cm and in the fundus on the left measuring 2.9 x 3.3 x 2.9 cm. Endometrial thickness is 12 mm. An IUD is visualized in the cervical area with arms oriented toward the uterine fundus. The ovaries are within normal limits. Presumed nabothian cysts are noted at the cervix. No free fluid in the pelvis. IMPRESSION: 1. No evidence of intrauterine pregnancy. Correlation with beta HCG is recommended. 2. IUD in the cervix with arms oriented towards the uterine fundus. 3. Uterine fibroids. Electronically Signed   By: Brett Fairy M.D.   On: 10/27/2021 20:25    MAU Course  Procedures  MDM Labs and [place] ordered. [place] reviewed: IUD displaced into cervix, no IUP or adnexal mass seen. ?POCs in vagina, sent to path. Suspect SAB but will follow quant. Consented for IUD removal. IUD body seen at os, removed intact without difficulty. Stable for discharge home.   Assessment and Plan   1. Pregnancy, location unknown   2. Rh(D) positive    Discharge home Follow [facility] at [facility] on 8/31- Dr. Alesia Richards notified of presentation, clinical findings, and plan Return precautions  Allergies as of 10/27/2021       Reactions   Tape Other (See Comments)   Medical redness, rash itching   Milk-related Compounds         Medication List     STOP taking these medications    cetirizine 10 MG tablet Commonly known as: ZyrTEC Allergy   famotidine 20 MG tablet Commonly known as: PEPCID   Melatonin + L-Theanine Caps   potassium chloride 10 MEQ tablet Commonly known as: KLOR-CON M   Vitamin D (Ergocalciferol) 1.25 MG (50000 UNIT) Caps capsule Commonly known as: DRISDOL       TAKE these medications    amLODipine 5 MG tablet Commonly known as: NORVASC TAKE 1 TABLET BY  MOUTH DAILY   chlorthalidone 25 MG tablet Commonly known as: HYGROTON TAKE 1 TABLET(25 MG) BY MOUTH DAILY   FLUoxetine 10 MG capsule Commonly known as: PROZAC Take 10 mg by mouth daily.        Julianne Handler, CNM 10/27/2021, 10:02 PM

## 2021-10-28 LAB — GC/CHLAMYDIA PROBE AMP (~~LOC~~) NOT AT ARMC
Chlamydia: NEGATIVE
Comment: NEGATIVE
Comment: NORMAL
Neisseria Gonorrhea: NEGATIVE

## 2021-10-29 LAB — SURGICAL PATHOLOGY

## 2022-03-05 ENCOUNTER — Encounter (HOSPITAL_COMMUNITY): Payer: Self-pay | Admitting: *Deleted

## 2022-03-05 ENCOUNTER — Inpatient Hospital Stay (HOSPITAL_COMMUNITY)
Admission: AD | Admit: 2022-03-05 | Discharge: 2022-03-05 | Disposition: A | Discharge status: Home / Self Care | Payer: No Typology Code available for payment source | Attending: Obstetrics and Gynecology | Admitting: Obstetrics and Gynecology

## 2022-03-05 DIAGNOSIS — B3731 Acute candidiasis of vulva and vagina: Secondary | ICD-10-CM | POA: Diagnosis not present

## 2022-03-05 DIAGNOSIS — O161 Unspecified maternal hypertension, first trimester: Secondary | ICD-10-CM | POA: Insufficient documentation

## 2022-03-05 DIAGNOSIS — O09521 Supervision of elderly multigravida, first trimester: Secondary | ICD-10-CM | POA: Insufficient documentation

## 2022-03-05 DIAGNOSIS — F419 Anxiety disorder, unspecified: Secondary | ICD-10-CM | POA: Insufficient documentation

## 2022-03-05 DIAGNOSIS — O98811 Other maternal infectious and parasitic diseases complicating pregnancy, first trimester: Secondary | ICD-10-CM | POA: Diagnosis present

## 2022-03-05 DIAGNOSIS — Z3A01 Less than 8 weeks gestation of pregnancy: Secondary | ICD-10-CM | POA: Diagnosis not present

## 2022-03-05 DIAGNOSIS — O99341 Other mental disorders complicating pregnancy, first trimester: Secondary | ICD-10-CM | POA: Diagnosis not present

## 2022-03-05 DIAGNOSIS — F32A Depression, unspecified: Secondary | ICD-10-CM | POA: Diagnosis not present

## 2022-03-05 DIAGNOSIS — O23591 Infection of other part of genital tract in pregnancy, first trimester: Secondary | ICD-10-CM | POA: Insufficient documentation

## 2022-03-05 DIAGNOSIS — O99351 Diseases of the nervous system complicating pregnancy, first trimester: Secondary | ICD-10-CM | POA: Insufficient documentation

## 2022-03-05 HISTORY — DX: Benign neoplasm of connective and other soft tissue, unspecified: D21.9

## 2022-03-05 LAB — URINALYSIS, ROUTINE W REFLEX MICROSCOPIC
Bacteria, UA: NONE SEEN
Bilirubin Urine: NEGATIVE
Glucose, UA: NEGATIVE mg/dL
Hgb urine dipstick: NEGATIVE
Ketones, ur: NEGATIVE mg/dL
Nitrite: NEGATIVE
Protein, ur: NEGATIVE mg/dL
Specific Gravity, Urine: 1.016 (ref 1.005–1.030)
pH: 6 (ref 5.0–8.0)

## 2022-03-05 LAB — WET PREP, GENITAL
Clue Cells Wet Prep HPF POC: NONE SEEN
Sperm: NONE SEEN
Trich, Wet Prep: NONE SEEN
WBC, Wet Prep HPF POC: >=10 — AB (ref <10)

## 2022-03-05 LAB — POCT PREGNANCY, URINE: Preg Test, Ur: POSITIVE — AB

## 2022-03-05 MED ORDER — TERCONAZOLE 0.4 % VA CREA: 1.0000 | TOPICAL_CREAM | Freq: Every day | VAGINAL | 0 refills | Status: AC

## 2022-03-05 NOTE — MAU Note (Signed)
Laurie Choi is a 42 y.o. at 28w1dhere in MAU reporting: been fighting a yeast infection for 2-3wks , GYN doesn't have anything available at this time.  Vaginal itching, cottage cheese d/c, burns when she pees.  Improved a little after a  '7day Monistat' treatment. Just had a miscarriage (8/29), doesn't want to do that again. Is preg, +HPT right before Christmas, has not been confirmed.  Had a little cramping last night, none now, denies pain. No bleeding.  LMP: 11/23 Onset of complaint: 2-3 wks Pain score: none There were no vitals filed for this visit.   Lab orders placed from triage:  UA, UPT, vag swabs

## 2022-03-05 NOTE — MAU Provider Note (Signed)
History     CSN: [id number]  Arrival date and time: 03/05/22 1914   Event Date/Time   First Provider Initiated Contact with Patient 03/05/22 1106      Chief Complaint  Patient presents with   Vaginal Discharge   Vaginal Itching   Possible Pregnancy   Laurie Choi is a 42 y.o. [id number] at 40w0dwho presents today with vaginal discharge and itching. She states that she used Monitstat 7 day treatment and completed the 7 days last week. However, she has continued to have itching and discharge.   Vaginal Discharge The patient's primary symptoms include genital itching and vaginal discharge. This is a new problem. The current episode started 1 to 4 weeks ago. The problem occurs constantly. The problem has been unchanged. The patient is experiencing no pain. She is pregnant. The vaginal discharge was thick and white. There has been no bleeding. Nothing aggravates the symptoms. She has tried antifungals for the symptoms. The treatment provided no relief.    OB History     Gravida  4   Para  2   Term  2   Preterm      AB  1   Living  2      SAB  1   IAB      Ectopic      Multiple      Live Births  2           Past Medical History:  Diagnosis Date   Anemia    Anxiety    Benign essential HTN    Depression    Fibroid    Heart valve problem    High blood pressure    Hyperemesis gravidarum before end of [redacted] week gestation, dehydration    Sleep apnea    Trouble in sleeping    Urticaria     Past Surgical History:  Procedure Laterality Date   PICC LINE PLACE PERIPHERAL ([facility])  2011   for rx and hydration 2nd hyperemesis    Family History  Problem Relation Age of Onset   Lupus Mother 335  Kidney disease Mother    Hypertension Father    Diabetes Father    Hypertension Paternal Grandmother    Hypertension Paternal Grandfather    Heart attack Maternal Grandmother     Social History   Tobacco Use   Smoking status: Never   Smokeless tobacco:  Never  Vaping Use   Vaping Use: Never used  Substance Use Topics   Alcohol use: No   Drug use: No    Allergies:  Allergies  Allergen Reactions   Tape Other (See Comments)    Medical redness, rash itching    Milk-Related Compounds     Medications Prior to Admission  Medication Sig Dispense Refill Last Dose   amLODipine (NORVASC) 5 MG tablet TAKE 1 TABLET BY MOUTH DAILY 90 tablet 0 03/04/2022   cetirizine (ZYRTEC) 10 MG chewable tablet Chew 10 mg by mouth daily.   03/04/2022   chlorthalidone (HYGROTON) 25 MG tablet TAKE 1 TABLET(25 MG) BY MOUTH DAILY 90 tablet 3 03/04/2022   FLUoxetine (PROZAC) 10 MG capsule Take 10 mg by mouth daily.       Review of Systems  Genitourinary:  Positive for vaginal discharge.  All other systems reviewed and are negative.  Physical Exam   Blood pressure 139/76, pulse 83, temperature 98.9 F (37.2 C), temperature source Oral, resp. rate 18, height '5\' 2"'$  (1.575 m), weight 106.4 kg, last  menstrual period 01/22/2022, SpO2 98 %, unknown if currently breastfeeding.  Physical Exam Constitutional:      Appearance: She is well-developed.  HENT:     Head: Normocephalic.  Eyes:     Pupils: Pupils are equal, round, and reactive to light.  Cardiovascular:     Rate and Rhythm: Normal rate.  Pulmonary:     Effort: Pulmonary effort is normal. No respiratory distress.  Abdominal:     Palpations: Abdomen is soft.     Tenderness: There is no abdominal tenderness.  Genitourinary:    Vagina: No bleeding. Vaginal discharge: mucusy.    Comments: External: no lesion Vagina: small amount of white discharge     Musculoskeletal:        General: Normal range of motion.     Cervical back: Normal range of motion and neck supple.  Skin:    General: Skin is warm and dry.  Neurological:     Mental Status: She is alert and oriented to person, place, and time.  Psychiatric:        Mood and Affect: Mood normal.        Behavior: Behavior normal.     Results for  orders placed or performed during the hospital encounter of 03/05/22 (from the past 24 hour(s))  Urinalysis, Routine w reflex microscopic Urine, Clean Catch     Status: Abnormal   Collection Time: 03/05/22 10:02 AM  Result Value Ref Range   Color, Urine YELLOW YELLOW   APPearance HAZY (A) CLEAR   Specific Gravity, Urine 1.016 1.005 - 1.030   pH 6.0 5.0 - 8.0   Glucose, UA NEGATIVE NEGATIVE mg/dL   Hgb urine dipstick NEGATIVE NEGATIVE   Bilirubin Urine NEGATIVE NEGATIVE   Ketones, ur NEGATIVE NEGATIVE mg/dL   Protein, ur NEGATIVE NEGATIVE mg/dL   Nitrite NEGATIVE NEGATIVE   Leukocytes,Ua TRACE (A) NEGATIVE   RBC / HPF 0-5 0 - 5 RBC/hpf   WBC, UA 0-5 0 - 5 WBC/hpf   Bacteria, UA NONE SEEN NONE SEEN   Squamous Epithelial / HPF 0-5 0 - 5 /HPF   Mucus PRESENT    Hyphae Yeast PRESENT    Hyaline Casts, UA PRESENT   Wet prep, genital     Status: Abnormal   Collection Time: 03/05/22 10:02 AM   Specimen: PATH Cytology Cervicovaginal Ancillary Only  Result Value Ref Range   Yeast Wet Prep HPF POC PRESENT (A) NONE SEEN   Trich, Wet Prep NONE SEEN NONE SEEN   Clue Cells Wet Prep HPF POC NONE SEEN NONE SEEN   WBC, Wet Prep HPF POC >=10 (A) <10   Sperm NONE SEEN   Pregnancy, urine POC     Status: Abnormal   Collection Time: 03/05/22 10:05 AM  Result Value Ref Range   Preg Test, Ur POSITIVE (A) NEGATIVE     MAU Course  Procedures  MDM   Assessment and Plan   1. Yeast infection involving the vagina and surrounding area   2. [redacted] weeks gestation of pregnancy    DC home in stable condition  1st Trimester precautions  RX: terazol  7 as directed 0RF  Return to MAU as needed FU with OB as planned   [facility] Follow up.   Specialty: Obstetrics and Gynecology Contact information: [address] [place] [id number] [place] DNP, CNM  03/05/22  11:32 AM

## 2022-03-06 LAB — GC/CHLAMYDIA PROBE AMP (~~LOC~~) NOT AT ARMC
Chlamydia: NEGATIVE
Comment: NEGATIVE
Comment: NORMAL
Neisseria Gonorrhea: NEGATIVE

## 2022-03-15 ENCOUNTER — Other Ambulatory Visit: Payer: Self-pay

## 2022-03-15 ENCOUNTER — Inpatient Hospital Stay (HOSPITAL_COMMUNITY): Payer: No Typology Code available for payment source

## 2022-03-15 ENCOUNTER — Inpatient Hospital Stay (HOSPITAL_COMMUNITY)
Admission: AD | Admit: 2022-03-15 | Discharge: 2022-03-15 | Disposition: A | Discharge status: Home / Self Care | Payer: No Typology Code available for payment source | Attending: Obstetrics and Gynecology | Admitting: Obstetrics and Gynecology

## 2022-03-15 DIAGNOSIS — O10911 Unspecified pre-existing hypertension complicating pregnancy, first trimester: Secondary | ICD-10-CM | POA: Diagnosis not present

## 2022-03-15 DIAGNOSIS — D259 Leiomyoma of uterus, unspecified: Secondary | ICD-10-CM | POA: Insufficient documentation

## 2022-03-15 DIAGNOSIS — O3410 Maternal care for benign tumor of corpus uteri, unspecified trimester: Secondary | ICD-10-CM | POA: Insufficient documentation

## 2022-03-15 DIAGNOSIS — O209 Hemorrhage in early pregnancy, unspecified: Secondary | ICD-10-CM

## 2022-03-15 DIAGNOSIS — Z3A01 Less than 8 weeks gestation of pregnancy: Secondary | ICD-10-CM | POA: Diagnosis not present

## 2022-03-15 DIAGNOSIS — O3680X0 Pregnancy with inconclusive fetal viability, not applicable or unspecified: Secondary | ICD-10-CM

## 2022-03-15 DIAGNOSIS — O4690 Antepartum hemorrhage, unspecified, unspecified trimester: Secondary | ICD-10-CM | POA: Insufficient documentation

## 2022-03-15 DIAGNOSIS — Z6790 Unspecified blood type, Rh positive: Secondary | ICD-10-CM

## 2022-03-15 LAB — URINALYSIS, ROUTINE W REFLEX MICROSCOPIC
Bilirubin Urine: NEGATIVE
Glucose, UA: NEGATIVE mg/dL
Ketones, ur: NEGATIVE mg/dL
Nitrite: NEGATIVE
Protein, ur: 100 mg/dL — AB
RBC / HPF: >50 RBC/hpf — ABNORMAL HIGH (ref 0–5)
Specific Gravity, Urine: 1.018 (ref 1.005–1.030)
pH: 6 (ref 5.0–8.0)

## 2022-03-15 LAB — CBC
HCT: 37.7 % (ref 36.0–46.0)
Hemoglobin: 12.4 g/dL (ref 12.0–15.0)
MCH: 26.6 pg (ref 26.0–34.0)
MCHC: 32.9 g/dL (ref 30.0–36.0)
MCV: 80.9 fL (ref 80.0–100.0)
Platelets: 328 10*3/uL (ref 150–400)
RBC: 4.66 MIL/uL (ref 3.87–5.11)
RDW: 15.9 % — ABNORMAL HIGH (ref 11.5–15.5)
WBC: 11.2 10*3/uL — ABNORMAL HIGH (ref 4.0–10.5)
nRBC: 0 % (ref 0.0–0.2)

## 2022-03-15 LAB — TYPE AND SCREEN
ABO/RH(D): B POS
Antibody Screen: NEGATIVE

## 2022-03-15 LAB — HCG, QUANTITATIVE, PREGNANCY: hCG, Beta Chain, Quant, S: 3621 m[IU]/mL — ABNORMAL HIGH (ref <5)

## 2022-03-15 NOTE — MAU Note (Signed)
.  Laurie Choi is a 42 y.o. at 21w3dhere in MAU reporting: lower abd cramping and spotting that started Friday.  Denies recent intercourse.   Onset of complaint: Friday  Pain score: 5/10 Vitals:   03/15/22 1458  BP: (!) 145/88  Pulse: 99  Resp: 17  Temp: 98.6 F (37 C)  SpO2: 98%   Lab orders placed from triage:   u/a

## 2022-03-16 ENCOUNTER — Inpatient Hospital Stay (HOSPITAL_COMMUNITY)
Admission: AD | Admit: 2022-03-16 | Discharge: 2022-03-17 | Disposition: A | Discharge status: Home / Self Care | Payer: No Typology Code available for payment source | Attending: Obstetrics and Gynecology | Admitting: Obstetrics and Gynecology

## 2022-03-16 ENCOUNTER — Encounter (HOSPITAL_COMMUNITY): Payer: Self-pay | Admitting: Obstetrics and Gynecology

## 2022-03-16 ENCOUNTER — Inpatient Hospital Stay (HOSPITAL_COMMUNITY): Payer: No Typology Code available for payment source

## 2022-03-16 ENCOUNTER — Other Ambulatory Visit: Payer: Self-pay

## 2022-03-16 DIAGNOSIS — O039 Complete or unspecified spontaneous abortion without complication: Secondary | ICD-10-CM

## 2022-03-16 DIAGNOSIS — Z3A01 Less than 8 weeks gestation of pregnancy: Secondary | ICD-10-CM | POA: Diagnosis not present

## 2022-03-16 LAB — URINALYSIS, ROUTINE W REFLEX MICROSCOPIC
Glucose, UA: NEGATIVE mg/dL
Ketones, ur: NEGATIVE mg/dL
Nitrite: POSITIVE — AB
Protein, ur: 100 mg/dL — AB
Specific Gravity, Urine: 1.02 (ref 1.005–1.030)
pH: 7 (ref 5.0–8.0)

## 2022-03-16 LAB — HEMOGLOBIN AND HEMATOCRIT, BLOOD
HCT: 36.7 % (ref 36.0–46.0)
Hemoglobin: 11.9 g/dL — ABNORMAL LOW (ref 12.0–15.0)

## 2022-03-16 LAB — URINALYSIS, MICROSCOPIC (REFLEX): RBC / HPF: >50 RBC/hpf (ref 0–5)

## 2022-03-16 NOTE — MAU Provider Note (Signed)
History     [id number]  Arrival date and time: 03/16/22 2049    Chief Complaint  Patient presents with   Abdominal Pain   Vaginal Bleeding     HPI Laurie Choi is a 42 y.o. at 54w4dwho presents for abdominal pain & vaginal bleeding. Symptoms worsened this evening. Filled a maxi pad in an hour. No blood clots. Abdominal cramping has worsened & made it difficult to sleep.    OB History     Gravida  4   Para  2   Term  2   Preterm      AB  1   Living  2      SAB  1   IAB      Ectopic      Multiple      Live Births  2           Past Medical History:  Diagnosis Date   Anemia    Anxiety    Depression    Fibroid    Heart valve problem    High blood pressure    Hyperemesis gravidarum before end of [redacted] week gestation, dehydration    Sleep apnea    Trouble in sleeping    Urticaria     Past Surgical History:  Procedure Laterality Date   PICC LINE PLACE PERIPHERAL [place]  2011   for rx and hydration 2nd hyperemesis    Family History  Problem Relation Age of Onset   Lupus Mother 337  Kidney disease Mother    Hypertension Father    Diabetes Father    Hypertension Paternal Grandmother    Hypertension Paternal Grandfather    Heart attack Maternal Grandmother     Allergies  Allergen Reactions   Tape Other (See Comments)    Medical redness, rash itching    Milk-Related Compounds     No current facility-administered medications on file prior to encounter.   Current Outpatient Medications on File Prior to Encounter  Medication Sig Dispense Refill   amLODipine (NORVASC) 5 MG tablet TAKE 1 TABLET BY MOUTH DAILY 90 tablet 0   cetirizine (ZYRTEC) 10 MG chewable tablet Chew 10 mg by mouth daily.     chlorthalidone (HYGROTON) 25 MG tablet TAKE 1 TABLET(25 MG) BY MOUTH DAILY 90 tablet 3     ROS Pertinent positives and negative per HPI, all others reviewed and negative  Physical Exam   BP 139/86   Pulse 91   Temp 98.6 F (37 C)  (Oral)   Resp 18   Ht '5\' 2"'$  (1.575 m)   Wt 107 kg   LMP 01/22/2022   SpO2 97%   BMI 43.15 kg/m   Patient Vitals for the past 24 hrs:  BP Temp Temp src Pulse Resp SpO2 Height Weight  03/16/22 2147 139/86 -- -- 91 -- -- -- --  03/16/22 2133 (!) 145/89 98.6 F (37 C) Oral 87 18 97 % '5\' 2"'$  (1.575 m) 107 kg    Physical Exam Vitals and nursing note reviewed. Exam conducted with a chaperone present.  Constitutional:      General: She is not in acute distress.    Appearance: She is well-developed.  HENT:     Head: Normocephalic and atraumatic.  Pulmonary:     Effort: Pulmonary effort is normal. No respiratory distress.  Genitourinary:    Comments: Pelvic exam: Small amount of dark red blood. Apparent gestational sac removed from os.  Skin:    General:  Skin is warm and dry.  Neurological:     Mental Status: She is alert.       Labs Results for orders placed or performed during the hospital encounter of 03/16/22 (from the past 24 hour(s))  Urinalysis, Routine w reflex microscopic Urine, Clean Catch     Status: Abnormal   Collection Time: 03/16/22 10:26 PM  Result Value Ref Range   Color, Urine RED (A) YELLOW   APPearance CLOUDY (A) CLEAR   Specific Gravity, Urine 1.020 1.005 - 1.030   pH 7.0 5.0 - 8.0   Glucose, UA NEGATIVE NEGATIVE mg/dL   Hgb urine dipstick LARGE (A) NEGATIVE   Bilirubin Urine SMALL (A) NEGATIVE   Ketones, ur NEGATIVE NEGATIVE mg/dL   Protein, ur 100 (A) NEGATIVE mg/dL   Nitrite POSITIVE (A) NEGATIVE   Leukocytes,Ua TRACE (A) NEGATIVE  Urinalysis, Microscopic (reflex)     Status: Abnormal   Collection Time: 03/16/22 10:26 PM  Result Value Ref Range   RBC / HPF >50 0 - 5 RBC/hpf   WBC, UA 0-5 0 - 5 WBC/hpf   Bacteria, UA FEW (A) NONE SEEN   Squamous Epithelial / HPF 0-5 0 - 5 /HPF   Mucus PRESENT   Hemoglobin and hematocrit, blood     Status: Abnormal   Collection Time: 03/16/22 10:51 PM  Result Value Ref Range   Hemoglobin 11.9 (L) 12.0 - 15.0  g/dL   HCT 36.7 36.0 - 46.0 %    Imaging US OB Transvaginal  Result Date: 03/16/2022 CLINICAL DATA:  History of vaginal bleeding and pelvic cramps EXAM: TRANSVAGINAL OB ULTRASOUND TECHNIQUE: Transvaginal ultrasound was performed for complete evaluation of the gestation as well as the maternal uterus, adnexal regions, and pelvic cul-de-sac. COMPARISON:  Ultrasound from the previous day. FINDINGS: Intrauterine gestational sac: Previously seen gestational sac is no longer identified most consistent within an interval miscarriage. Endometrial stripe measures 1 cm. The previously seen fibroids are not as well appreciated on today's exam. Maternal uterus/adnexae: Dominant follicle is noted within the right ovary measuring up to 3.4 cm. IMPRESSION: Previously seen gestational sac appears to have passed consistent with interval miscarriage. Stable right ovarian follicle.  No follow-up is recommended. Electronically Signed   By: Inez Catalina M.D.   On: 03/16/2022 23:20    MAU Course  Procedures Lab Orders         Culture, OB Urine         Urinalysis, Routine w reflex microscopic Urine, Clean Catch         hCG, quantitative, pregnancy         Hemoglobin and hematocrit, blood         Urinalysis, Microscopic (reflex)    No orders of the defined types were placed in this encounter.  Imaging Orders         US OB Transvaginal     MDM RH positive Hemoglobin stable Passed gestational sac during pelvic exam. Ultrasound today shows IUGS previously seen is no longer present  [facility] CNM (Jade) notified of miscarriage diagnosis & will facilitate scheduling of follow up Assessment and Plan   1. Miscarriage   2. [redacted] weeks gestation of pregnancy    -Reviewed bleeding precautions & reasons to return to MAU -F/u with [facility] - recommend calling office if she hasn't heard by Wednesday morning -Pelvic rest   Jorje Guild, NP 03/16/22 11:40 PM

## 2022-03-16 NOTE — MAU Note (Signed)
.  Laurie Choi is a 42 y.o. at 33w4dhere in MAU reporting: was here yesterday for VB and cramping. Cramping pain has gotten worse, along with pelvic pressure. States she feels like she is bleeding through her clothes, but when she looks "it's not that bad" - reports using 1 pad in the past hour.   Pain score: 8 - abdomen; 6 - pelvis  Vitals:   03/16/22 2133  BP: (!) 145/89  Pulse: 87  Resp: 18  Temp: 98.6 F (37 C)  SpO2: 97%     FHT:NA  Lab orders placed from triage:  UA

## 2022-03-16 NOTE — Discharge Instructions (Signed)
Return to care  If you have heavier bleeding that soaks through more than 2 pads per hour for an hour or more If you bleed so much that you feel like you might pass out or you do pass out If you have significant abdominal pain that is not improved with Tylenol   

## 2022-03-17 LAB — HCG, QUANTITATIVE, PREGNANCY: hCG, Beta Chain, Quant, S: 2474 m[IU]/mL — ABNORMAL HIGH (ref <5)

## 2022-03-17 NOTE — MAU Provider Note (Signed)
Chief Complaint: Abdominal Pain and Vaginal Bleeding   None     SUBJECTIVE HPI: Laurie Choi is a 42 y.o. [id number] at [id number] LMP who presents to maternity admissions reporting vaginal spotting and cramping.     HPI  Past Medical History:  Diagnosis Date   Anemia    Anxiety    Depression    Fibroid    Heart valve problem    High blood pressure    Hyperemesis gravidarum before end of [redacted] week gestation, dehydration    Sleep apnea    Trouble in sleeping    Urticaria    Past Surgical History:  Procedure Laterality Date   PICC LINE PLACE PERIPHERAL ([facility])  2011   for rx and hydration 2nd hyperemesis   Social History   Socioeconomic History   Marital status: Married    Spouse name: JBabara [place]  Number of children: 2   Years of education: Not on file   Highest education level: Not on file  Occupational History   Occupation: Stay at home mother    Employer: UNEMPLOYED  Tobacco Use   Smoking status: Never   Smokeless tobacco: Never  Vaping Use   Vaping Use: Never used  Substance and Sexual Activity   Alcohol use: No   Drug use: No   Sexual activity: Yes    Birth control/protection: None  Other Topics Concern   Not on file  Social History Narrative   Not on file   Social Determinants of Health   Financial Resource Strain: Not on file  Food Insecurity: Not on file  Transportation Needs: Not on file  Physical Activity: Not on file  Stress: Not on file  Social Connections: Not on file  Intimate Partner Violence: Not on file   No current facility-administered medications on file prior to encounter.   Current Outpatient Medications on File Prior to Encounter  Medication Sig Dispense Refill   amLODipine (NORVASC) 5 MG tablet TAKE 1 TABLET BY MOUTH DAILY 90 tablet 0   cetirizine (ZYRTEC) 10 MG chewable tablet Chew 10 mg by mouth daily.     chlorthalidone (HYGROTON) 25 MG tablet TAKE 1 TABLET(25 MG) BY MOUTH DAILY 90 tablet 3   Allergies  Allergen  Reactions   Tape Other (See Comments)    Medical redness, rash itching    Milk-Related Compounds     ROS:  Review of Systems  Constitutional:  Negative for chills, fatigue and fever.  Respiratory:  Negative for shortness of breath.   Cardiovascular:  Negative for chest pain.  Gastrointestinal:  Positive for abdominal pain.  Genitourinary:  Positive for vaginal bleeding. Negative for difficulty urinating, dysuria, flank pain, pelvic pain, vaginal discharge and vaginal pain.  Neurological:  Negative for dizziness and headaches.  Psychiatric/Behavioral: Negative.       I have reviewed patient's Past Medical Hx, Surgical Hx, Family Hx, Social Hx, medications and allergies.   Physical Exam  No data found. Constitutional: Well-developed, well-nourished female in no acute distress.  Cardiovascular: normal rate Respiratory: normal effort GI: Abd soft, non-tender. Pos BS x 4 MS: Extremities nontender, no edema, normal ROM Neurologic: Alert and oriented x 4.  GU: Neg CVAT.  PELVIC EXAM: Deferred   LAB RESULTS No results found for this or any previous visit (from the past 24 hour(s)). Results for orders placed or performed during the hospital encounter of 03/15/22 (from the past 48 hour(s))  Urinalysis, Routine w reflex microscopic Urine, Clean Catch  Status: Abnormal   Collection Time: 03/15/22  3:29 PM  Result Value Ref Range   Color, Urine AMBER (A) YELLOW    Comment: BIOCHEMICALS MAY BE AFFECTED BY COLOR   APPearance CLOUDY (A) CLEAR   Specific Gravity, Urine 1.018 1.005 - 1.030   pH 6.0 5.0 - 8.0   Glucose, UA NEGATIVE NEGATIVE mg/dL   Hgb urine dipstick LARGE (A) NEGATIVE   Bilirubin Urine NEGATIVE NEGATIVE   Ketones, ur NEGATIVE NEGATIVE mg/dL   Protein, ur 100 (A) NEGATIVE mg/dL   Nitrite NEGATIVE NEGATIVE   Leukocytes,Ua MODERATE (A) NEGATIVE   RBC / HPF >50 (H) 0 - 5 RBC/hpf   WBC, UA 0-5 0 - 5 WBC/hpf   Bacteria, UA RARE (A) NONE SEEN   Squamous Epithelial /  HPF 11-20 0 - 5 /HPF   Mucus PRESENT     Comment: Performed at [facility], [address] [place], [place] [address]  Type and screen [place]     Status: None   Collection Time: 03/15/22  6:07 PM  Result Value Ref Range   ABO/RH(D) B POS    Antibody Screen NEG    Sample Expiration      03/18/2022,2359 Performed at [facility], [place] [address] [place], [place] [address]   CBC     Status: Abnormal   Collection Time: 03/15/22  6:07 PM  Result Value Ref Range   WBC 11.2 (H) 4.0 - 10.5 K/uL   RBC 4.66 3.87 - 5.11 MIL/uL   Hemoglobin 12.4 12.0 - 15.0 g/dL   HCT 37.7 36.0 - 46.0 %   MCV 80.9 80.0 - 100.0 fL   MCH 26.6 26.0 - 34.0 pg   MCHC 32.9 30.0 - 36.0 g/dL   RDW 15.9 (H) 11.5 - 15.5 %   Platelets 328 150 - 400 K/uL   nRBC 0.0 0.0 - 0.2 %    Comment: Performed at [facility], [place] [address] [place], [place] [address]  hCG, quantitative, pregnancy     Status: Abnormal   Collection Time: 03/15/22  6:07 PM  Result Value Ref Range   hCG, Beta Chain, Quant, S 3,621 (H) <5 mIU/mL    Comment:          GEST. AGE      CONC.  (mIU/mL)   <=1 WEEK        5 - 50     2 WEEKS       50 - 500     3 WEEKS       100 - 10,000     4 WEEKS     1,000 - 30,000     5 WEEKS     3,500 - 115,000   6-8 WEEKS     12,000 - 270,000    12 WEEKS     15,000 - 220,000        FEMALE AND NON-PREGNANT FEMALE:     LESS THAN 5 mIU/mL Performed at [facility], [place] [address], [place], [place] [address]     --/--/B POS (01/14 1807)  IMAGING   US OB LESS THAN 14 WEEKS WITH OB TRANSVAGINAL  Result Date: 03/15/2022 CLINICAL DATA:  Vaginal bleeding, pregnant EXAM: OBSTETRIC <14 WK [place] AND TRANSVAGINAL OB US TECHNIQUE: Both transabdominal and transvaginal ultrasound examinations were performed for complete evaluation of the gestation as well as the maternal uterus, adnexal regions, and pelvic cul-de-sac. Transvaginal technique was performed to assess  early pregnancy.  COMPARISON:  None Available. FINDINGS: Intrauterine gestational sac: Single. The gestational sac is elongated and abnormally located within the lower uterine segment. Yolk sac:  Not Visualized. Embryo:  Not Visualized. Cardiac Activity: Not Visualized. MSD: 8.9 mm   5 w   5 d Subchorionic hemorrhage:  Moderate Maternal uterus/adnexae: Multiple uterine fibroids are identified, largest in the fundus measuring 7.3 x 7.2 x 7.8 cm. The left ovary is unremarkable measuring 3.0 x 2.7 x 2.2 cm. There is a simple cyst or follicle within the right ovary measuring 3.6 x 3.7 x 3.9 cm. No pelvic free fluid. IMPRESSION: 1. Probable early intrauterine gestational sac, but no yolk sac, fetal pole, or cardiac activity yet visualized. Recommend follow-up quantitative B-HCG levels and follow-up US in 14 days to assess viability. The moderate subchorionic hemorrhage as well as the abnormal appearance of the location of the gestational sac is concerning for failed pregnancy. This recommendation follows SRU consensus guidelines: Diagnostic Criteria for Nonviable Pregnancy Early in the First Trimester. [place] Med 2013; 627:0350-09. 2. 3.9 cm simple right ovarian cyst. 3. Uterine fibroids. Electronically Signed   By: Randa Ngo M.D.   On: 03/15/2022 19:42    MAU Management/MDM: Orders Placed This Encounter  Procedures   US OB LESS THAN 14 WEEKS WITH OB TRANSVAGINAL   Urinalysis, Routine w reflex microscopic Urine, Clean Catch   CBC   hCG, quantitative, pregnancy   Type and screen [place]   Discharge patient    No orders of the defined types were placed in this encounter.   [place] today with probably gestational sac in lower uterine segment.  Bleeding minimal, pt stable.  Discussed findings with pt and her s/o as suspicious but not definitive for miscarriage.  Since GS is probably, will plan to have pt return for hcg in 48 hours, scheduled with our practice but pt to call [facility] tomorrow and establish f/u  with her providers.  Bleeding/return to MAu precautions reviewed.    ASSESSMENT 1. Pregnancy, location unknown   2. [redacted] weeks gestation of pregnancy   3. Vaginal bleeding affecting early pregnancy   4. Rh(D) positive   5. Chronic hypertension in obstetric context in first trimester     PLAN Discharge home Allergies as of 03/15/2022       Reactions   Tape Other (See Comments)   Medical redness, rash itching   Milk-related Compounds         Medication List     TAKE these medications    amLODipine 5 MG tablet Commonly known as: NORVASC TAKE 1 TABLET BY MOUTH DAILY   cetirizine 10 MG chewable tablet Commonly known as: ZYRTEC Chew 10 mg by mouth daily.   chlorthalidone 25 MG tablet Commonly known as: HYGROTON TAKE 1 TABLET(25 MG) [facility] Follow up in 3 day(s).   Specialty: Obstetrics and Gynecology Why: As scheduled Contact information: [address] [place] [address] [facility] Follow up.   Specialty: Obstetrics and Gynecology Why: As needed for emergencies Contact information: [address] [id number] [place]ia Greenleaf Certified Nurse-Midwife 03/17/2022  2:30 PM

## 2022-03-18 ENCOUNTER — Other Ambulatory Visit: Payer: Self-pay

## 2022-03-18 LAB — CULTURE, OB URINE
Culture: 20000 — AB
Special Requests: NORMAL

## 2022-03-19 ENCOUNTER — Encounter: Payer: Self-pay | Admitting: Advanced Practice Midwife

## 2022-03-19 DIAGNOSIS — R8271 Bacteriuria: Secondary | ICD-10-CM | POA: Insufficient documentation

## 2022-06-26 ENCOUNTER — Other Ambulatory Visit: Payer: Self-pay | Admitting: Cardiovascular Disease

## 2022-10-28 ENCOUNTER — Telehealth: Payer: Self-pay | Admitting: Diagnostic Neuroimaging

## 2022-10-28 NOTE — Telephone Encounter (Signed)
Received sleep referral from Dr. Babette Relic Boy at [facility] for morning HA's, daytime somnolence, loud snorer, obesity. Placed in sleep referrals box

## 2022-12-08 ENCOUNTER — Encounter: Payer: Self-pay | Admitting: *Deleted

## 2022-12-09 ENCOUNTER — Encounter: Payer: Self-pay | Admitting: Neurology

## 2022-12-09 ENCOUNTER — Ambulatory Visit (INDEPENDENT_AMBULATORY_CARE_PROVIDER_SITE_OTHER): Payer: No Typology Code available for payment source | Admitting: Neurology

## 2022-12-09 VITALS — BP 129/83 | HR 89 | Ht 62.0 in | Wt 233.0 lb

## 2022-12-09 DIAGNOSIS — Z9189 Other specified personal risk factors, not elsewhere classified: Secondary | ICD-10-CM | POA: Diagnosis not present

## 2022-12-09 DIAGNOSIS — R635 Abnormal weight gain: Secondary | ICD-10-CM

## 2022-12-09 DIAGNOSIS — R351 Nocturia: Secondary | ICD-10-CM

## 2022-12-09 DIAGNOSIS — R0683 Snoring: Secondary | ICD-10-CM | POA: Diagnosis not present

## 2022-12-09 DIAGNOSIS — G4719 Other hypersomnia: Secondary | ICD-10-CM

## 2022-12-09 DIAGNOSIS — G4700 Insomnia, unspecified: Secondary | ICD-10-CM

## 2022-12-09 DIAGNOSIS — R0689 Other abnormalities of breathing: Secondary | ICD-10-CM

## 2022-12-09 DIAGNOSIS — R519 Headache, unspecified: Secondary | ICD-10-CM

## 2022-12-09 NOTE — Patient Instructions (Signed)

## 2022-12-09 NOTE — Progress Notes (Signed)
Subjective:    Patient ID: Laurie Choi is a 42 y.o. female.  HPI    Laurie Foley, MD, PhD [facility] [address] [address] [place], [place] [address]  Dear Dr. Leavy Laurie Choi,  I saw your patient, Laurie Choi, upon your kind request and sleep clinic today for initial consultation of her sleep disorder, in particular, concern for underlying obstructive sleep apnea.  The patient is unaccompanied today.  As you know, Ms. Laurie Choi is a 42 year old female with an underlying medical history of hypertension, prediabetes, anemia, anxiety, depression, vitamin B12 deficiency, vitamin D deficiency, and morbid obesity with a BMI of over 40, who reports snoring and excessive daytime somnolence.  He occasionally wakes up with a sense of gasping for air, particularly after she initially falls asleep.  She is not aware of any family history of sleep apnea.  She has had difficulty initiating and maintaining sleep, has tried melatonin which has helped but she does not take it regularly.  Bedtime is around 10 but she may not be asleep until early morning hours.  Rise time is around 6:30 AM.  She has had slow weight gain over time.  She had 2 miscarriages last year.  She lives with her husband and 2 girls, ages 77 and 86, they have a small dog in the household.  She does not TV in her bedroom and tends to let it stay on all night.  He has nocturia about 2-4 times per average night.  She has occasionally woken up with a headache which is typically self-limited.  She drinks caffeine in the form of tea or soda, usually 1 serving per day and less than 1 cup of coffee per day on average.  She works for [employer].  Her Epworth sleepiness score is 11 out of 24, fatigue severity score is 54 out of 63.  I reviewed your office note from 07/06/2022. Her weight has been more or less stable recently.  Does not drink alcohol.  She is a non-smoker.  Her Past Medical History Is Significant  For: Past Medical History:  Diagnosis Date   Anemia    Anxiety    Depression    Fibroid    Heart valve problem    High blood pressure    Hyperemesis gravidarum before end of [redacted] week gestation, dehydration    Prediabetes    Sleep apnea    Trouble in sleeping    Urticaria    Vitamin B12 deficiency    Vitamin D deficiency     Her Past Surgical History Is Significant For: Past Surgical History:  Procedure Laterality Date   PICC LINE PLACE PERIPHERAL ([facility] HX)  2011   for rx and hydration 2nd hyperemesis    Her Family History Is Significant For: Family History  Problem Relation Age of Onset   Lupus Mother 63   Kidney disease Mother    Hypertension Father    Diabetes Father    Hypertension Paternal Grandmother    Hypertension Paternal Grandfather    Heart attack Maternal Grandmother     Her Social History Is Significant For: Social History   Socioeconomic History   Marital status: Married    Spouse name: Laurie Choi   Number of children: 2   Years of education: Not on file   Highest education level: Not on file  Occupational History   Occupation: Stay at home mother    Employer: UNEMPLOYED  Tobacco Use   Smoking status: Never  Smokeless tobacco: Never  Vaping Use   Vaping status: Never Used  Substance and Sexual Activity   Alcohol use: No   Drug use: No   Sexual activity: Yes    Birth control/protection: None  Other Topics Concern   Not on file  Social History Narrative   Not on file   Social Determinants of Health   Financial Resource Strain: Not on file  Food Insecurity: Not on file  Transportation Needs: Not on file  Physical Activity: Not on file  Stress: Not on file  Social Connections: Not on file    Her Allergies Are:  Allergies  Allergen Reactions   Tape Other (See Comments)    Medical redness, rash itching    Milk-Related Compounds   :   Her Current Medications Are:  Outpatient Encounter Medications as of 12/09/2022   Medication Sig   amLODipine (NORVASC) 5 MG tablet TAKE 1 TABLET BY MOUTH DAILY   cetirizine (ZYRTEC) 10 MG chewable tablet Chew 10 mg by mouth daily.   chlorthalidone (HYGROTON) 25 MG tablet TAKE 1 TABLET(25 MG) BY MOUTH DAILY   potassium chloride SA (KLOR-CON M) 20 MEQ tablet Take 20 mEq by mouth daily.   Semaglutide-Weight Management (WEGOVY) 0.5 MG/0.5ML SOAJ Inject 0.5 mg into the skin once a week.   No facility-administered encounter medications on file as of 12/09/2022.  :   Review of Systems:  Out of a complete 14 point review of systems, all are reviewed and negative with the exception of these symptoms as listed below:  Review of Systems  Neurological:        Pt here for sleep consult Pt snores,headaches,fatigue, controlled hypertension Pt denies sleep study,CPAP machine     ESS:11 FSS:54     Objective:  Neurological Exam  Physical Exam Physical Examination:   Vitals:   12/09/22 1357  BP: 129/83  Pulse: 89    General Examination: The patient is a very pleasant 42 y.o. female in no acute distress. She appears well-developed and well-nourished and well groomed.   HEENT: Normocephalic, atraumatic, pupils are equal, round and reactive to light, extraocular tracking is good without limitation to gaze excursion or nystagmus noted. Hearing is grossly intact. Face is symmetric with normal facial animation. Speech is clear with no dysarthria noted. There is no hypophonia. There is no lip, neck/head, jaw or voice tremor. Neck is supple with full range of passive and active motion. There are no carotid bruits on auscultation. Oropharynx exam reveals: No significant mouth dryness, good dental hygiene, mild airway crowding secondary to small airway entry.  Mallampati class II.  Tonsils on the smaller side.  Tongue protrudes centrally and palate elevates symmetrically, neck circumference 15 inches, minimal overbite noted.   Chest: Clear to auscultation without wheezing, rhonchi or  crackles noted.  Heart: S1+S2+0, regular and normal without murmurs, rubs or gallops noted.   Abdomen: Soft, non-tender and non-distended.  Extremities: There is no pitting edema in the distal lower extremities bilaterally.   Skin: Warm and dry without trophic changes noted.   Musculoskeletal: exam reveals no obvious joint deformities.   Neurologically:  Mental status: The patient is awake, alert and oriented in all 4 spheres. Her immediate and remote memory, attention, language skills and fund of knowledge are appropriate. There is no evidence of aphasia, agnosia, apraxia or anomia. Speech is clear with normal prosody and enunciation. Thought process is linear. Mood is normal and affect is normal.  Cranial nerves II - XII are as described above  under HEENT exam.  Motor exam: Normal bulk, strength and tone is noted. There is no obvious action or resting tremor.  Fine motor skills and coordination: grossly intact.  Cerebellar testing: No dysmetria or intention tremor. There is no truncal or gait ataxia.  Sensory exam: intact to light touch in the upper and lower extremities.  Gait, station and balance: She stands easily. No veering to one side is noted. No leaning to one side is noted. Posture is age-appropriate and stance is narrow based. Gait shows normal stride length and normal pace. No problems turning are noted.   Assessment and Plan:   In summary, ZHANIYA SWALLOWS is a very pleasant 42 y.o.-year old female with an underlying medical history of hypertension, prediabetes, anemia, anxiety, depression, vitamin B12 deficiency, vitamin D deficiency, and morbid obesity with a BMI of over 40, whose history and physical exam are concerning for sleep disordered breathing, particularly obstructive sleep apnea (OSA) versus upper airway resistance syndrome (UARS) versus central sleep apnea (CSA), or mixed sleep apnea.  While a laboratory attended sleep study is typically considered "gold standard"  for evaluation of sleep disordered breathing, we mutually agreed to proceed with a home sleep test at this time.  She is encouraged to try melatonin again at night for sleep, up to 10 mg strength.    I had a long chat with the patient about my findings and the diagnosis of sleep apnea, particularly OSA, its prognosis and treatment options. We talked about medical/conservative treatments, surgical interventions and non-pharmacological approaches for symptom control. I explained, in particular, the risks and ramifications of untreated moderate to severe OSA, especially with respect to developing cardiovascular disease down the road, including congestive heart failure (CHF), difficult to treat hypertension, cardiac arrhythmias (particularly A-fib), neurovascular complications including TIA, stroke and dementia. Even type 2 diabetes has, in part, been linked to untreated OSA. Symptoms of untreated OSA may include (but may not be limited to) daytime sleepiness, nocturia (i.e. frequent nighttime urination), memory problems, mood irritability and suboptimally controlled or worsening mood disorder such as depression and/or anxiety, lack of energy, lack of motivation, physical discomfort, as well as recurrent headaches, especially morning or nocturnal headaches. We talked about the importance of maintaining a healthy lifestyle and striving for healthy weight. In addition, we talked about the importance of striving for and maintaining good sleep hygiene. I recommended a sleep study at this time. I outlined the differences between a laboratory attended sleep study which is considered more comprehensive and accurate over the option of a home sleep test (HST); the latter may lead to underestimation of sleep disordered breathing in some instances and does not help with diagnosing upper airway resistance syndrome and is not accurate enough to diagnose primary central sleep apnea typically.  We talked about the possibility of  utilizing a CPAP or AutoPap machine for treatment of sleep apnea.  She would be willing to trial a PAP machine if the need arises.    We will pick up our discussion about the next steps and treatment options after testing.  We will keep her posted as to the test results by phone call and/or MyChart messaging where possible.  We will plan to follow-up in sleep clinic accordingly as well.  I answered all her questions today and the patient was in agreement.   I encouraged her to call with any interim questions, concerns, problems or updates or email [place] through MyChart.  Generally speaking, sleep test authorizations may take up to 2  weeks, sometimes less, sometimes longer, the patient is encouraged to get in touch with [place] if they do not hear back from the sleep lab staff directly within the next 2 weeks.  Thank you very much for allowing me to participate in the care of this nice patient. If I can be of any further assistance to you please do not hesitate to call me at [phone].  Sincerely,   Laurie Foley, MD, PhD

## 2023-01-04 ENCOUNTER — Ambulatory Visit (INDEPENDENT_AMBULATORY_CARE_PROVIDER_SITE_OTHER): Payer: No Typology Code available for payment source | Admitting: Neurology

## 2023-01-04 DIAGNOSIS — R0683 Snoring: Secondary | ICD-10-CM

## 2023-01-04 DIAGNOSIS — G4719 Other hypersomnia: Secondary | ICD-10-CM

## 2023-01-04 DIAGNOSIS — R351 Nocturia: Secondary | ICD-10-CM

## 2023-01-04 DIAGNOSIS — G4733 Obstructive sleep apnea (adult) (pediatric): Secondary | ICD-10-CM

## 2023-01-04 DIAGNOSIS — R0689 Other abnormalities of breathing: Secondary | ICD-10-CM

## 2023-01-04 DIAGNOSIS — R635 Abnormal weight gain: Secondary | ICD-10-CM

## 2023-01-04 DIAGNOSIS — G4700 Insomnia, unspecified: Secondary | ICD-10-CM

## 2023-01-04 DIAGNOSIS — R519 Headache, unspecified: Secondary | ICD-10-CM

## 2023-01-04 DIAGNOSIS — Z9189 Other specified personal risk factors, not elsewhere classified: Secondary | ICD-10-CM

## 2023-03-10 ENCOUNTER — Telehealth: Payer: Self-pay

## 2023-03-10 NOTE — Telephone Encounter (Signed)
 Patient was mailed a home sleep study device and never completed sleep study. Device was dropped back off at our office with no reason as to why and the study was not completed. We attempted to call the patient but never received a call or message back. Orders have been cancelled at this time.

## 2023-03-10 NOTE — Telephone Encounter (Signed)
 Edit to add - the device was dropped in our return box for reusable HSTs. The seal on the device appeared unbroken and unused. Contact was attempted to be made on 01/14/23 when device was found. Patient did not answer the phone, so the sleep lab left a message requesting a call back to discuss if there were issues or if the patient did not wish to move forward with the sleep study seeing that the device was returned to us  unused. Patient never returned call or called back to [facility] since. Sleep lab has kept device to the side waiting to make sure patient did not call back and wish to use device. After the New Year it was discovered charges had not been dropped for patient's sleep study and the sleep lab looked further into patient finding that the patient did in fact complete her sleep study but returned the box to our office looking like it was unused. Patient has still not called our office back or made any attempts at contacting the sleep lab. It does not appear the patient has been seen by any providers in the [facility] [employer]) since being seen in our office last October 2024.   I have added patient's results to her chart and sent her results to Dr. Buck.

## 2023-03-11 ENCOUNTER — Other Ambulatory Visit: Payer: Self-pay | Admitting: Neurology

## 2023-03-11 DIAGNOSIS — G4733 Obstructive sleep apnea (adult) (pediatric): Secondary | ICD-10-CM

## 2023-03-23 ENCOUNTER — Telehealth: Payer: Self-pay | Admitting: *Deleted

## 2023-03-23 NOTE — Telephone Encounter (Signed)
Informed pt of their sleep study results. The study showed moderate OSA . Per Dr. Teofilo Pod recommendations, the pt was advised to start autopap therapy at home. She has verbalized understanding and agreement to proceed with autopap. Her questions were answered. We discussed the insurance compliance requirements which includes using the machine at least 4 hours at night and also being seen by our office between 30 and 90 days after setup. The pt scheduled initial f/u for 06/10/23 at 1:15 pm. She will let us know ahead of time if she wants to change it to virtual. Discussed DME, will refer to [employer]. Pt verbalized appreciation for the call.   Result sent to referring provider. Order sent to [employer].

## 2023-03-23 NOTE — Telephone Encounter (Signed)
New, Maryella Shivers, Otilio Jefferson, RN; Salena Saner; Kathe Becton; 1 other Received, thank you!

## 2023-03-23 NOTE — Telephone Encounter (Signed)
-----   Message from Huston Foley sent at 03/11/2023  5:51 PM EST ----- Patient referred by PCP, seen by me on 12/09/22, patient had a HST on 01/11/23.    Please call and notify the patient that the recent home sleep test showed obstructive sleep apnea in the moderate range. I recommend treatment in the form of autoPAP, which means, that we don't have to bring her in for a sleep study with CPAP, but will let her start using a so called autoPAP machine at home, which is a CPAP-like machine with self-adjusting pressures. We will send the order to a local DME company (of her choice, or as per insurance requirement). The DME representative will fit her with a mask, educate her on how to use the machine, how to put the mask on, etc. I have placed an order in the chart. Please send the order, talk to patient, send report to referring MD. We will need a FU in sleep clinic for 10 weeks post-PAP set up, please arrange that with me or one of our NPs. Also reinforce the need for compliance with treatment. Thanks,   Huston Foley, MD, PhD [facility])

## 2023-04-05 NOTE — Telephone Encounter (Signed)
Received faxed message from [facility] that pt has declined to move forward with set up at this time due for pt out of pocket cost.  Pt ware can give them a call if they decide to move forward.  04-02-2023 1045.

## 2023-06-10 ENCOUNTER — Ambulatory Visit: Payer: No Typology Code available for payment source | Admitting: Adult Health
